# Patient Record
Sex: Female | Born: 1945 | Race: White | Hispanic: No | Smoking: Former smoker
Health system: Southern US, Community
[De-identification: ages and names within clinical notes are randomized; demographics above are authoritative.]

## PROBLEM LIST (undated history)

## (undated) DIAGNOSIS — K219 Gastro-esophageal reflux disease without esophagitis: Secondary | ICD-10-CM

## (undated) DIAGNOSIS — E039 Hypothyroidism, unspecified: Secondary | ICD-10-CM

## (undated) DIAGNOSIS — M1990 Unspecified osteoarthritis, unspecified site: Secondary | ICD-10-CM

## (undated) HISTORY — PX: CHOLECYSTECTOMY: SHX55

## (undated) HISTORY — PX: ABDOMINAL HYSTERECTOMY: SHX81

---

## 2004-09-03 ENCOUNTER — Ambulatory Visit: Payer: Self-pay | Admitting: General Practice

## 2005-10-06 ENCOUNTER — Ambulatory Visit: Payer: Self-pay | Admitting: General Practice

## 2005-11-18 ENCOUNTER — Ambulatory Visit: Payer: Self-pay | Admitting: Gastroenterology

## 2006-11-02 ENCOUNTER — Ambulatory Visit: Payer: Self-pay | Admitting: Endocrinology

## 2007-12-13 ENCOUNTER — Ambulatory Visit: Payer: Self-pay | Admitting: Internal Medicine

## 2008-07-20 ENCOUNTER — Emergency Department: Payer: Self-pay | Admitting: Emergency Medicine

## 2008-08-31 ENCOUNTER — Inpatient Hospital Stay: Payer: Self-pay | Admitting: Surgery

## 2009-01-17 ENCOUNTER — Ambulatory Visit: Payer: Self-pay | Admitting: Internal Medicine

## 2009-11-27 DIAGNOSIS — Z0000 Encounter for general adult medical examination without abnormal findings: Secondary | ICD-10-CM | POA: Insufficient documentation

## 2009-11-27 DIAGNOSIS — M810 Age-related osteoporosis without current pathological fracture: Secondary | ICD-10-CM | POA: Insufficient documentation

## 2010-01-23 ENCOUNTER — Ambulatory Visit: Payer: Self-pay | Admitting: Family Medicine

## 2010-12-18 DIAGNOSIS — E782 Mixed hyperlipidemia: Secondary | ICD-10-CM | POA: Insufficient documentation

## 2010-12-18 DIAGNOSIS — J019 Acute sinusitis, unspecified: Secondary | ICD-10-CM | POA: Insufficient documentation

## 2011-02-10 ENCOUNTER — Ambulatory Visit: Payer: Self-pay | Admitting: Family Medicine

## 2011-02-11 ENCOUNTER — Ambulatory Visit: Payer: Self-pay | Admitting: Gastroenterology

## 2013-01-11 ENCOUNTER — Ambulatory Visit: Payer: Self-pay | Admitting: Family Medicine

## 2013-01-17 ENCOUNTER — Ambulatory Visit: Payer: Self-pay | Admitting: Family Medicine

## 2013-04-28 DIAGNOSIS — R42 Dizziness and giddiness: Secondary | ICD-10-CM | POA: Insufficient documentation

## 2013-04-28 DIAGNOSIS — J309 Allergic rhinitis, unspecified: Secondary | ICD-10-CM | POA: Insufficient documentation

## 2013-05-18 DIAGNOSIS — N951 Menopausal and female climacteric states: Secondary | ICD-10-CM | POA: Insufficient documentation

## 2013-05-18 DIAGNOSIS — R922 Inconclusive mammogram: Secondary | ICD-10-CM | POA: Insufficient documentation

## 2014-01-29 ENCOUNTER — Ambulatory Visit: Payer: Self-pay | Admitting: Family Medicine

## 2015-01-22 DIAGNOSIS — R300 Dysuria: Secondary | ICD-10-CM | POA: Insufficient documentation

## 2015-05-17 ENCOUNTER — Other Ambulatory Visit: Payer: Self-pay | Admitting: Family Medicine

## 2015-05-17 DIAGNOSIS — Z1231 Encounter for screening mammogram for malignant neoplasm of breast: Secondary | ICD-10-CM

## 2015-05-22 ENCOUNTER — Ambulatory Visit
Admission: RE | Admit: 2015-05-22 | Discharge: 2015-05-22 | Disposition: A | Discharge status: Home / Self Care | Payer: Medicare HMO | Source: Ambulatory Visit | Attending: Family Medicine | Admitting: Family Medicine

## 2015-05-22 ENCOUNTER — Other Ambulatory Visit: Payer: Self-pay | Admitting: Family Medicine

## 2015-05-22 DIAGNOSIS — Z1231 Encounter for screening mammogram for malignant neoplasm of breast: Secondary | ICD-10-CM

## 2015-07-29 ENCOUNTER — Encounter
Admission: RE | Disposition: A | Discharge status: Home / Self Care | Payer: Self-pay | Source: Ambulatory Visit | Attending: Gastroenterology

## 2015-07-29 ENCOUNTER — Ambulatory Visit
Admission: RE | Admit: 2015-07-29 | Discharge: 2015-07-29 | Disposition: A | Discharge status: Home / Self Care | Payer: Medicare HMO | Source: Ambulatory Visit | Attending: Gastroenterology | Admitting: Gastroenterology

## 2015-07-29 ENCOUNTER — Ambulatory Visit: Payer: Medicare HMO | Admitting: Anesthesiology

## 2015-07-29 ENCOUNTER — Encounter: Payer: Self-pay | Admitting: Anesthesiology

## 2015-07-29 DIAGNOSIS — Z87891 Personal history of nicotine dependence: Secondary | ICD-10-CM | POA: Insufficient documentation

## 2015-07-29 DIAGNOSIS — Z90710 Acquired absence of both cervix and uterus: Secondary | ICD-10-CM | POA: Insufficient documentation

## 2015-07-29 DIAGNOSIS — Z9049 Acquired absence of other specified parts of digestive tract: Secondary | ICD-10-CM | POA: Insufficient documentation

## 2015-07-29 DIAGNOSIS — Z79899 Other long term (current) drug therapy: Secondary | ICD-10-CM | POA: Insufficient documentation

## 2015-07-29 DIAGNOSIS — Z7951 Long term (current) use of inhaled steroids: Secondary | ICD-10-CM | POA: Diagnosis not present

## 2015-07-29 DIAGNOSIS — K5730 Diverticulosis of large intestine without perforation or abscess without bleeding: Secondary | ICD-10-CM | POA: Insufficient documentation

## 2015-07-29 DIAGNOSIS — M1990 Unspecified osteoarthritis, unspecified site: Secondary | ICD-10-CM | POA: Diagnosis not present

## 2015-07-29 DIAGNOSIS — Z1211 Encounter for screening for malignant neoplasm of colon: Secondary | ICD-10-CM | POA: Diagnosis present

## 2015-07-29 DIAGNOSIS — E039 Hypothyroidism, unspecified: Secondary | ICD-10-CM | POA: Diagnosis not present

## 2015-07-29 HISTORY — DX: Gastro-esophageal reflux disease without esophagitis: K21.9

## 2015-07-29 HISTORY — DX: Unspecified osteoarthritis, unspecified site: M19.90

## 2015-07-29 HISTORY — DX: Hypothyroidism, unspecified: E03.9

## 2015-07-29 HISTORY — PX: COLONOSCOPY WITH PROPOFOL: SHX5780

## 2015-07-29 SURGERY — COLONOSCOPY WITH PROPOFOL
Anesthesia: General

## 2015-07-29 MED ORDER — PROPOFOL 500 MG/50ML IV EMUL
INTRAVENOUS | Status: DC | PRN
  Administered 2015-07-29: 150 ug/kg/min via INTRAVENOUS

## 2015-07-29 MED ORDER — PROPOFOL 10 MG/ML IV BOLUS
INTRAVENOUS | Status: DC | PRN
  Administered 2015-07-29 (×2): 50 mg via INTRAVENOUS

## 2015-07-29 MED ORDER — SODIUM CHLORIDE 0.9 % IV SOLN
INTRAVENOUS | Status: DC
  Administered 2015-07-29: 15:00:00 via INTRAVENOUS
  Administered 2015-07-29: 1000 mL via INTRAVENOUS

## 2015-07-29 MED ORDER — SODIUM CHLORIDE 0.9 % IV SOLN: INTRAVENOUS | Status: DC

## 2015-07-29 NOTE — H&P (Signed)
    Primary Care Physician:  Dortha KernBLISS, LAURA K, MD Primary Gastroenterologist:  Dr. Bluford Kaufmannh  Pre-Procedure History & Physical: HPI:  Sheila Johnston is a 70 y.o. female is here for an colonoscopy   Past Medical History  Diagnosis Date  . Arthritis   . Hypothyroidism     Past Surgical History  Procedure Laterality Date  . Cholecystectomy    . Abdominal hysterectomy      Prior to Admission medications   Medication Sig Start Date End Date Taking? Authorizing Provider  Calcium Ascorbate 500 MG TABS Take 500 tablets by mouth daily.   Yes Historical Provider, MD  calcium citrate-vitamin D (CITRACAL+D) 315-200 MG-UNIT tablet Take 1 tablet by mouth 2 (two) times daily.   Yes Historical Provider, MD  Cyanocobalamin (VITAMIN B 12) 250 MCG LOZG Take 2 tablets by mouth daily.   Yes Historical Provider, MD  estradiol (CLIMARA - DOSED IN MG/24 HR) 0.0375 mg/24hr patch Place 0.0375 mg onto the skin once a week.   Yes Historical Provider, MD  fluticasone (FLONASE) 50 MCG/ACT nasal spray Place 2 sprays into both nostrils daily.   Yes Historical Provider, MD  levothyroxine (SYNTHROID, LEVOTHROID) 75 MCG tablet Take 75 mcg by mouth daily before breakfast.   Yes Historical Provider, MD  Multiple Vitamin (MULTIVITAMIN WITH MINERALS) TABS tablet Take 1 tablet by mouth daily.   Yes Historical Provider, MD  omega-3 acid ethyl esters (LOVAZA) 1 g capsule Take 1 g by mouth 2 (two) times daily.   Yes Historical Provider, MD    Allergies as of 07/23/2015  . (Not on File)    History reviewed. No pertinent family history.  Social History   Social History  . Marital Status: Married    Spouse Name: N/A  . Number of Children: N/A  . Years of Education: N/A   Occupational History  . Not on file.   Social History Main Topics  . Smoking status: Former Games developermoker  . Smokeless tobacco: Not on file  . Alcohol Use: No  . Drug Use: No  . Sexual Activity: Not on file   Other Topics Concern  . Not on file   Social  History Narrative    Review of Systems: See HPI, otherwise negative ROS  Physical Exam: There were no vitals taken for this visit. General:   Alert,  pleasant and cooperative in NAD Head:  Normocephalic and atraumatic. Neck:  Supple; no masses or thyromegaly. Lungs:  Clear throughout to auscultation.    Heart:  Regular rate and rhythm. Abdomen:  Soft, nontender and nondistended. Normal bowel sounds, without guarding, and without rebound.   Neurologic:  Alert and  oriented x4;  grossly normal neurologically.  Impression/Plan: Sheila Johnston is here for an colonoscopy to be performed for screening Risks, benefits, limitations, and alternatives regarding  colonoscopy have been reviewed with the patient.  Questions have been answered.  All parties agreeable.   Sheila Johnston, Ezzard StandingPAUL Y, MD  07/29/2015, 1:53 PM

## 2015-07-29 NOTE — Anesthesia Preprocedure Evaluation (Signed)
Anesthesia Evaluation  Patient identified by MRN, date of birth, ID band Patient awake    Reviewed: Allergy & Precautions, NPO status , Patient's Chart, lab work & pertinent test results, reviewed documented beta blocker date and time   Airway Mallampati: II  TM Distance: >3 FB     Dental  (+) Chipped   Pulmonary former smoker,           Cardiovascular      Neuro/Psych    GI/Hepatic   Endo/Other  Hypothyroidism   Renal/GU      Musculoskeletal  (+) Arthritis ,   Abdominal   Peds  Hematology   Anesthesia Other Findings   Reproductive/Obstetrics                             Anesthesia Physical Anesthesia Plan  ASA: III  Anesthesia Plan: General   Post-op Pain Management:    Induction: Intravenous  Airway Management Planned: Nasal Cannula  Additional Equipment:   Intra-op Plan:   Post-operative Plan:   Informed Consent: I have reviewed the patients History and Physical, chart, labs and discussed the procedure including the risks, benefits and alternatives for the proposed anesthesia with the patient or authorized representative who has indicated his/her understanding and acceptance.     Plan Discussed with: CRNA  Anesthesia Plan Comments:         Anesthesia Quick Evaluation

## 2015-07-29 NOTE — Op Note (Signed)
[facility] Patient Name: Sheila Johnston Procedure Date: 07/29/2015 2:49 PM MRN: [id number] Account #: [id number] Date of Birth: 09/18/1945 Admit Type: Outpatient Age: 70 Room: [facility] ENDO ROOM 4 Gender: Female Note Status: Finalized Procedure:            Colonoscopy Indications:          Screening for colorectal malignant neoplasm Providers:            Ezzard Standing. Bluford Kaufmann, MD Referring MD:         Dortha Kern (Referring MD) Medicines:            Monitored Anesthesia Care Complications:        No immediate complications. Procedure:            Pre-Anesthesia Assessment:                       - Prior to the procedure, a History and Physical was                        performed, and patient medications, allergies and                        sensitivities were reviewed. The patient's tolerance of                        previous anesthesia was reviewed.                       - The risks and benefits of the procedure and the                        sedation options and risks were discussed with the                        patient. All questions were answered and informed                        consent was obtained.                       - After reviewing the risks and benefits, the patient                        was deemed in satisfactory condition to undergo the                        procedure.                       After obtaining informed consent, the colonoscope was                        passed under direct vision. Throughout the procedure,                        the patient's blood pressure, pulse, and oxygen                        saturations were monitored continuously. The  Colonoscope was introduced through the anus and                        advanced to the the cecum, identified by appendiceal                        orifice and ileocecal valve. The colonoscopy was                        performed with difficulty due to multiple  diverticula                        in the colon. The patient tolerated the procedure well. Findings:      Multiple small-mouthed diverticula were found in the sigmoid colon.      The exam was otherwise without abnormality. Impression:           - Diverticulosis in the sigmoid colon.                       - The examination was otherwise normal.                       - No specimens collected. Recommendation:       - Discharge patient to home.                       - The findings and recommendations were discussed with                        the patient. Procedure Code(s):    --- Professional ---                       [id number], Colonoscopy, flexible; diagnostic, including                        collection of specimen(s) by brushing or washing, when                        performed (separate procedure) Diagnosis Code(s):    --- Professional ---                       Z12.11, Encounter for screening for malignant neoplasm                        of colon                       K57.30, Diverticulosis of large intestine without                        perforation or abscess without bleeding CPT copyright 2016 American Medical Association. All rights reserved. The codes documented in this report are preliminary and upon coder review may  be revised to meet current compliance requirements. Wallace CullensPaul Y Ravindra Baranek, MD 07/29/2015 3:33:26 PM This report has been signed electronically. Number of Addenda: 0 Note Initiated On: 07/29/2015 2:49 PM Scope Withdrawal Time: 0 hours 7 minutes 58 seconds  Total Procedure Duration: 0 hours 15 minutes 45 seconds       [facility]

## 2015-07-29 NOTE — Transfer of Care (Signed)
Immediate Anesthesia Transfer of Care Note  Patient: Sheila Johnston  Procedure(s) Performed: Procedure(s): COLONOSCOPY WITH PROPOFOL (N/A)  Patient Location: PACU  Anesthesia Type:General  Level of Consciousness: sedated  Airway & Oxygen Therapy: Patient Spontanous Breathing and Patient connected to nasal cannula oxygen  Post-op Assessment: Report given to RN and Post -op Vital signs reviewed and stable  Post vital signs: Reviewed and stable  Last Vitals:  Filed Vitals:   07/29/15 1443  BP: 117/55  Pulse: 81  Temp: 36.4 C  Resp: 18    Complications: No apparent anesthesia complications

## 2015-08-01 NOTE — Anesthesia Postprocedure Evaluation (Signed)
Anesthesia Post Note  Patient: Sheila Johnston  Procedure(s) Performed: Procedure(s) (LRB): COLONOSCOPY WITH PROPOFOL (N/A)  Patient location during evaluation: Endoscopy Anesthesia Type: General Level of consciousness: awake and alert Pain management: pain level controlled Vital Signs Assessment: post-procedure vital signs reviewed and stable Respiratory status: spontaneous breathing, nonlabored ventilation, respiratory function stable and patient connected to nasal cannula oxygen Cardiovascular status: blood pressure returned to baseline and stable Postop Assessment: no signs of nausea or vomiting Anesthetic complications: no    Last Vitals:  Filed Vitals:   07/29/15 1556 07/29/15 1606  BP: 103/71 100/68  Pulse: 75 76  Temp:    Resp: 16 15    Last Pain:  Filed Vitals:   07/30/15 0729  PainSc: 0-No pain                 Angeleah Labrake S

## 2016-08-18 ENCOUNTER — Other Ambulatory Visit: Payer: Self-pay | Admitting: Family Medicine

## 2016-08-18 DIAGNOSIS — N951 Menopausal and female climacteric states: Secondary | ICD-10-CM

## 2016-08-19 ENCOUNTER — Telehealth: Payer: Self-pay | Admitting: *Deleted

## 2016-08-19 ENCOUNTER — Other Ambulatory Visit: Payer: Self-pay | Admitting: Family Medicine

## 2016-08-19 DIAGNOSIS — Z1231 Encounter for screening mammogram for malignant neoplasm of breast: Secondary | ICD-10-CM

## 2016-08-19 DIAGNOSIS — N951 Menopausal and female climacteric states: Secondary | ICD-10-CM

## 2016-08-19 DIAGNOSIS — Z87891 Personal history of nicotine dependence: Secondary | ICD-10-CM

## 2016-08-19 NOTE — Telephone Encounter (Signed)
Received referral for initial lung cancer screening scan. Contacted patient and obtained smoking history,(former, quit 04/14/2007, 43 pack year) as well as answering questions related to screening process. Patient denies signs of lung cancer such as weight loss or hemoptysis. Patient denies comorbidity that would prevent curative treatment if lung cancer were found. Patient is scheduled for shared decision making visit and CT scan on 09/01/16.

## 2016-08-31 ENCOUNTER — Encounter: Payer: Self-pay | Admitting: Oncology

## 2016-09-01 ENCOUNTER — Ambulatory Visit
Admission: RE | Admit: 2016-09-01 | Discharge: 2016-09-01 | Disposition: A | Discharge status: Home / Self Care | Payer: Medicare HMO | Source: Ambulatory Visit | Attending: Oncology | Admitting: Oncology

## 2016-09-01 ENCOUNTER — Inpatient Hospital Stay: Payer: Medicare HMO | Attending: Oncology | Admitting: Oncology

## 2016-09-01 DIAGNOSIS — Z87891 Personal history of nicotine dependence: Secondary | ICD-10-CM

## 2016-09-01 DIAGNOSIS — Z122 Encounter for screening for malignant neoplasm of respiratory organs: Secondary | ICD-10-CM

## 2016-09-02 ENCOUNTER — Encounter: Payer: Self-pay | Admitting: *Deleted

## 2016-09-07 DIAGNOSIS — Z87891 Personal history of nicotine dependence: Secondary | ICD-10-CM | POA: Insufficient documentation

## 2016-09-07 NOTE — Progress Notes (Signed)
In accordance with CMS guidelines, patient has met eligibility criteria including age, absence of signs or symptoms of lung cancer.  Social History  Substance Use Topics  . Smoking status: Former Smoker    Packs/day: 1.00    Years: 43.00    Quit date: 04/14/2007  . Smokeless tobacco: Not on file  . Alcohol use No     A shared decision-making session was conducted prior to the performance of CT scan. This includes one or more decision aids, includes benefits and harms of screening, follow-up diagnostic testing, over-diagnosis, false positive rate, and total radiation exposure.  Counseling on the importance of adherence to annual lung cancer LDCT screening, impact of co-morbidities, and ability or willingness to undergo diagnosis and treatment is imperative for compliance of the program.  Counseling on the importance of continued smoking cessation for former smokers; the importance of smoking cessation for current smokers, and information about tobacco cessation interventions have been given to patient including [place] and [employer] quit [place] programs.  Written order for lung cancer screening with LDCT has been given to the patient and any and all questions have been answered to the best of my abilities.   Yearly follow up will be coordinated by Burgess Estelle, Thoracic Navigator.

## 2016-10-26 ENCOUNTER — Ambulatory Visit
Admission: RE | Admit: 2016-10-26 | Discharge: 2016-10-26 | Disposition: A | Discharge status: Home / Self Care | Payer: Medicare HMO | Source: Ambulatory Visit | Attending: Family Medicine | Admitting: Family Medicine

## 2016-10-26 DIAGNOSIS — N951 Menopausal and female climacteric states: Secondary | ICD-10-CM | POA: Diagnosis not present

## 2016-10-26 DIAGNOSIS — Z1231 Encounter for screening mammogram for malignant neoplasm of breast: Secondary | ICD-10-CM | POA: Diagnosis not present

## 2017-08-30 ENCOUNTER — Telehealth: Payer: Self-pay | Admitting: *Deleted

## 2017-08-30 DIAGNOSIS — Z122 Encounter for screening for malignant neoplasm of respiratory organs: Secondary | ICD-10-CM

## 2017-08-30 DIAGNOSIS — Z87891 Personal history of nicotine dependence: Secondary | ICD-10-CM

## 2017-08-30 NOTE — Telephone Encounter (Signed)
Notified patient that annual lung cancer screening low dose CT scan is due currently or will be in near future. Confirmed that patient is within the age range of 55-77, and asymptomatic, (no signs or symptoms of lung cancer). Patient denies illness that would prevent curative treatment for lung cancer if found. Verified smoking history, (former, quit 2009, 43 pack year). The shared decision making visit was done 09/01/16. Patient is agreeable for CT scan being scheduled.

## 2017-09-09 ENCOUNTER — Ambulatory Visit
Admission: RE | Admit: 2017-09-09 | Discharge: 2017-09-09 | Disposition: A | Discharge status: Home / Self Care | Payer: Medicare HMO | Source: Ambulatory Visit | Attending: Oncology | Admitting: Oncology

## 2017-09-09 DIAGNOSIS — Z87891 Personal history of nicotine dependence: Secondary | ICD-10-CM | POA: Diagnosis not present

## 2017-09-09 DIAGNOSIS — I700 Atherosclerosis of aorta: Secondary | ICD-10-CM | POA: Insufficient documentation

## 2017-09-09 DIAGNOSIS — J432 Centrilobular emphysema: Secondary | ICD-10-CM | POA: Diagnosis not present

## 2017-09-09 DIAGNOSIS — J438 Other emphysema: Secondary | ICD-10-CM | POA: Insufficient documentation

## 2017-09-09 DIAGNOSIS — Z122 Encounter for screening for malignant neoplasm of respiratory organs: Secondary | ICD-10-CM | POA: Diagnosis not present

## 2017-09-13 ENCOUNTER — Encounter: Payer: Self-pay | Admitting: *Deleted

## 2017-11-30 ENCOUNTER — Other Ambulatory Visit: Payer: Self-pay | Admitting: Family Medicine

## 2017-11-30 DIAGNOSIS — Z1231 Encounter for screening mammogram for malignant neoplasm of breast: Secondary | ICD-10-CM

## 2017-12-17 ENCOUNTER — Ambulatory Visit
Admission: RE | Admit: 2017-12-17 | Discharge: 2017-12-17 | Disposition: A | Discharge status: Home / Self Care | Payer: Medicare HMO | Source: Ambulatory Visit | Attending: Family Medicine | Admitting: Family Medicine

## 2017-12-17 DIAGNOSIS — Z1231 Encounter for screening mammogram for malignant neoplasm of breast: Secondary | ICD-10-CM | POA: Insufficient documentation

## 2017-12-20 ENCOUNTER — Other Ambulatory Visit: Payer: Self-pay | Admitting: Family Medicine

## 2017-12-20 DIAGNOSIS — N6489 Other specified disorders of breast: Secondary | ICD-10-CM

## 2017-12-20 DIAGNOSIS — R928 Other abnormal and inconclusive findings on diagnostic imaging of breast: Secondary | ICD-10-CM

## 2017-12-29 ENCOUNTER — Ambulatory Visit
Admission: RE | Admit: 2017-12-29 | Discharge: 2017-12-29 | Disposition: A | Discharge status: Home / Self Care | Payer: Medicare HMO | Source: Ambulatory Visit | Attending: Family Medicine | Admitting: Family Medicine

## 2017-12-29 DIAGNOSIS — R928 Other abnormal and inconclusive findings on diagnostic imaging of breast: Secondary | ICD-10-CM | POA: Diagnosis not present

## 2017-12-29 DIAGNOSIS — N6489 Other specified disorders of breast: Secondary | ICD-10-CM | POA: Diagnosis present

## 2018-07-19 IMAGING — CT CT CHEST LUNG CANCER SCREENING LOW DOSE W/O CM
1 of 2 series · 15 of 40 positions shown, 19 images · non-contrast
Comparison: None.

CLINICAL DATA: 71-year-old female former smoker, quit 9 years ago,
with 43 pack-year history of smoking, for initial lung cancer
screening

EXAM:
CT CHEST WITHOUT CONTRAST LOW-DOSE FOR LUNG CANCER SCREENING
TECHNIQUE: Multidetector CT imaging of the chest was performed following the
standard protocol without IV contrast.

[Series 2: axial st · axial · 0.66mm/px · z∈[-619,-334]mm · 15 of 63 slices shown, 19 images]
[im 3/63  mediastinal]
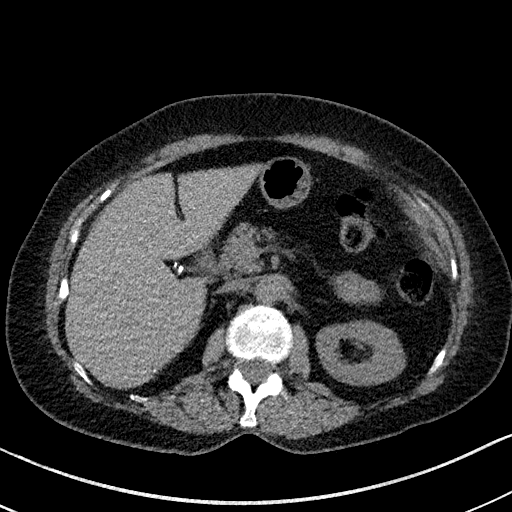
[im 3/63  lung]
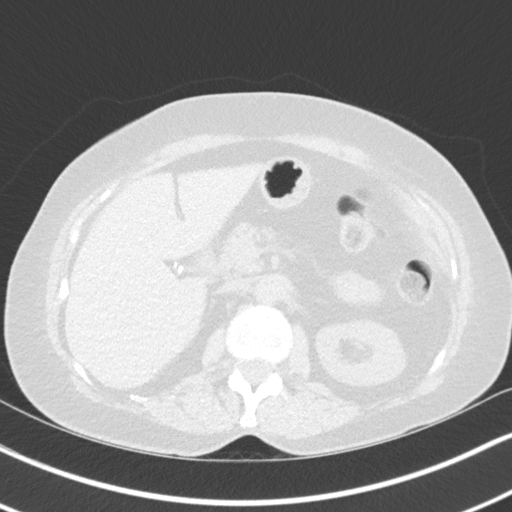
[im 8/63  lung]
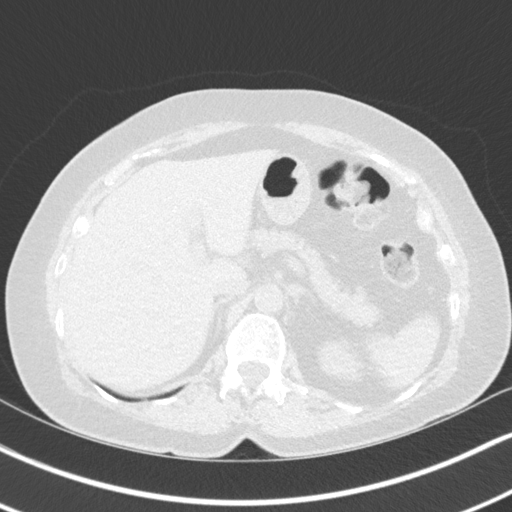
[im 12/63  lung]
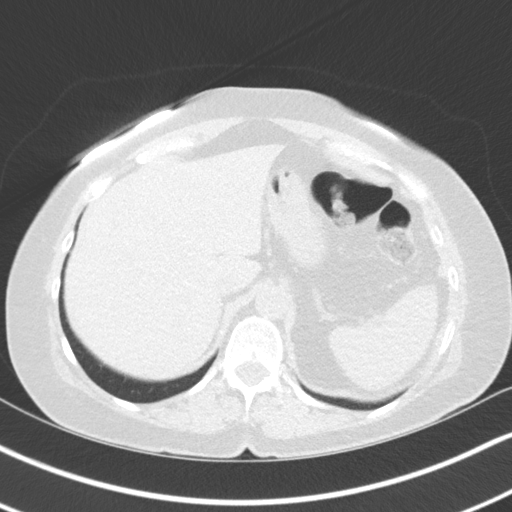
[im 16/63  lung]
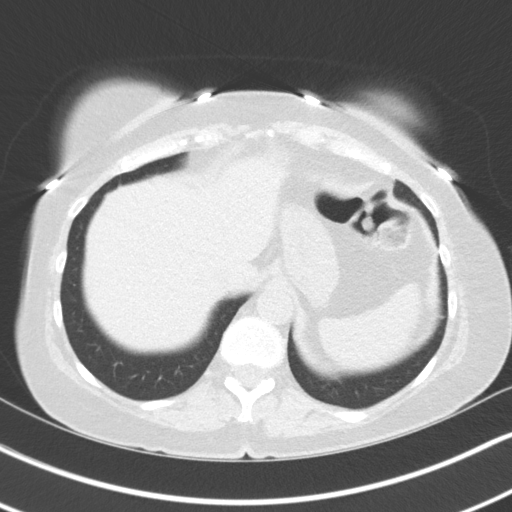
[im 20/63  mediastinal]
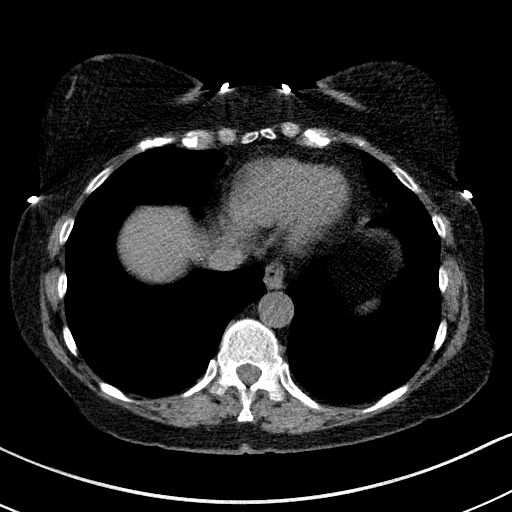
[im 20/63  lung]
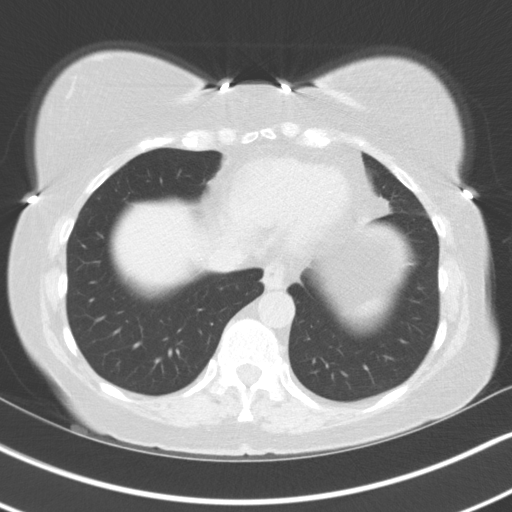
[im 24/63  lung]
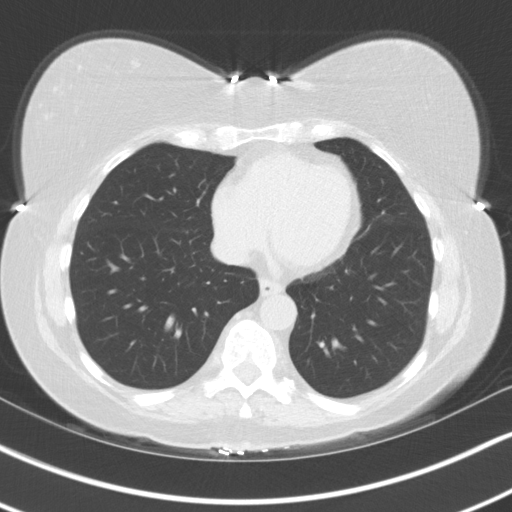
[im 29/63  lung]
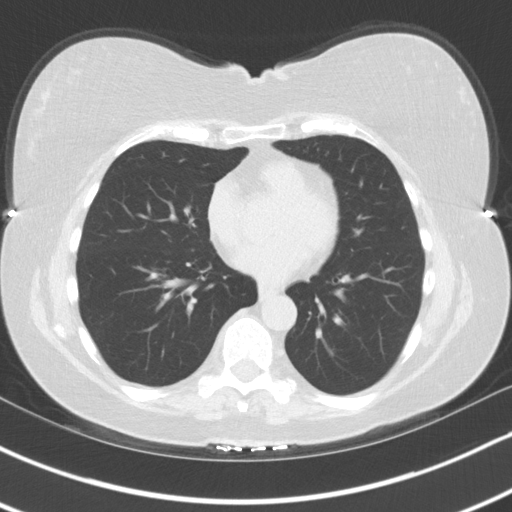
[im 32/63  lung]
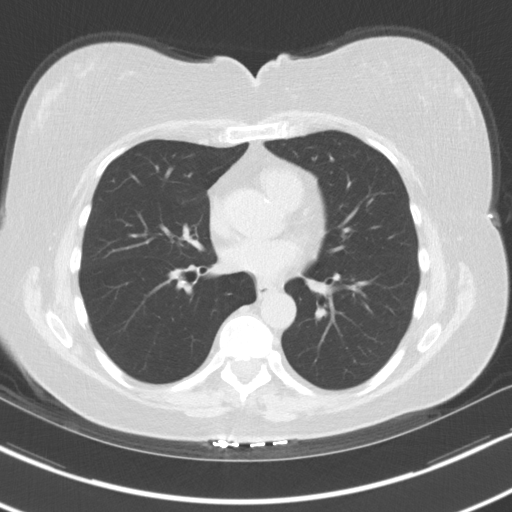
[im 34/63  mediastinal]
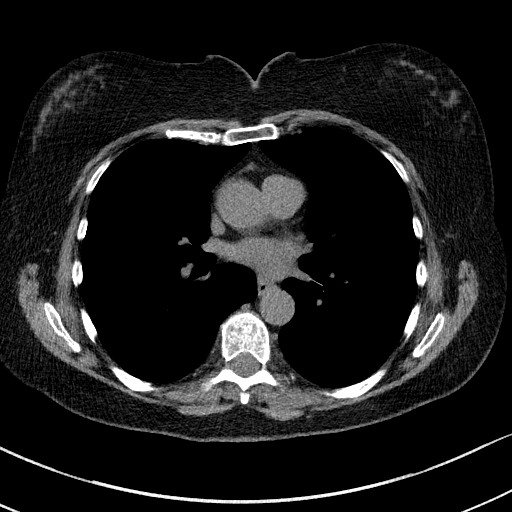
[im 34/63  lung]
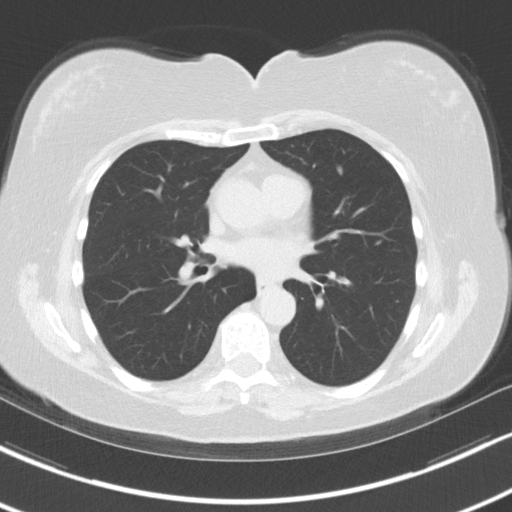
[im 39/63  lung]
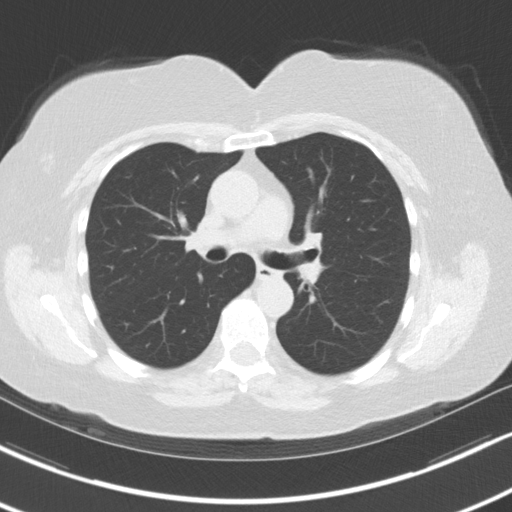
[im 43/63  lung]
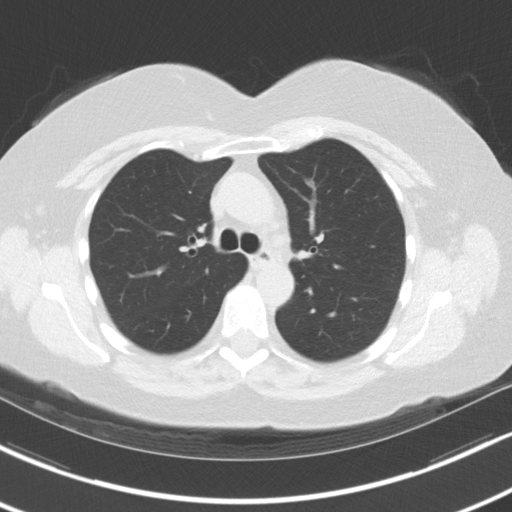
[im 47/63  lung]
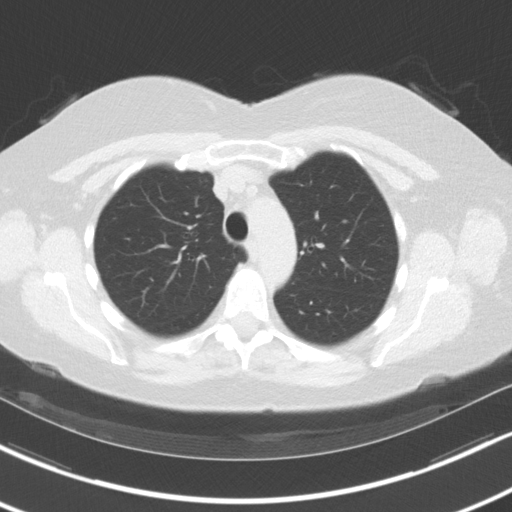
[im 51/63  mediastinal]
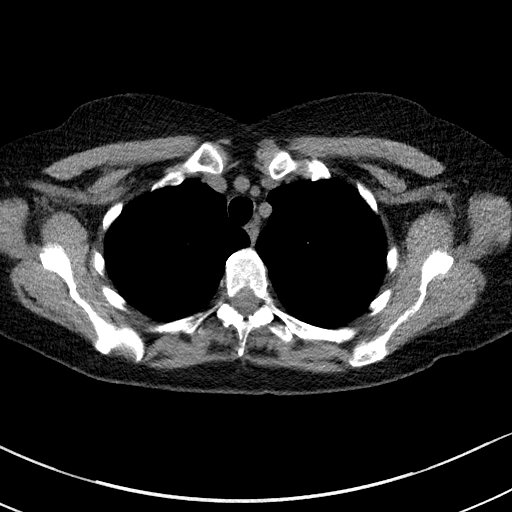
[im 51/63  lung]
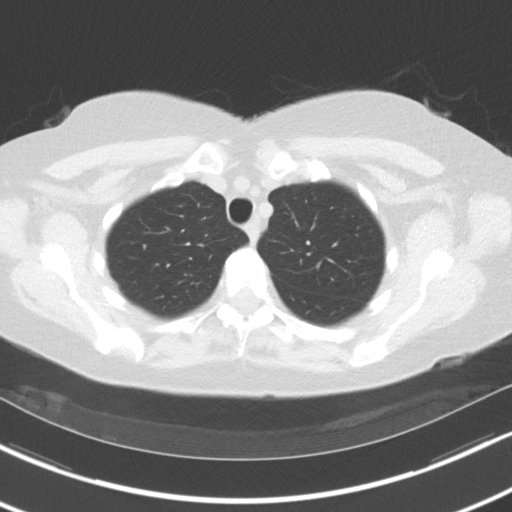
[im 55/63  lung]
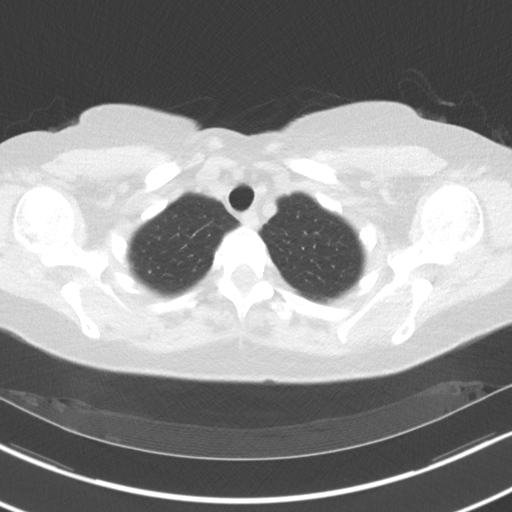
[im 60/63  lung]
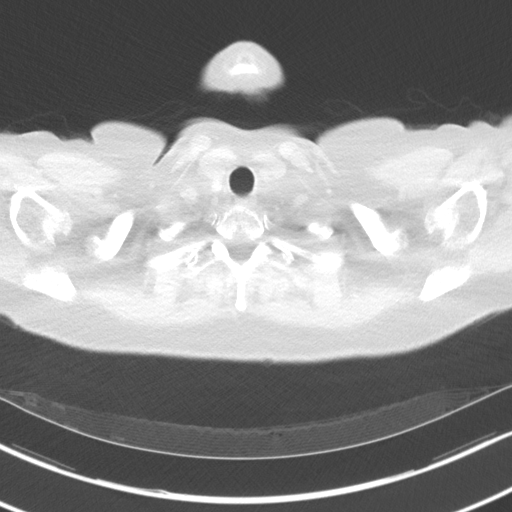

[15 of 40 positions shown; findings below may reference images not displayed]

FINDINGS: Cardiovascular: The heart is normal in size. No pericardial
effusion.

No evidence of thoracic aortic aneurysm. Mild atherosclerotic
calcifications.

Mediastinum/Nodes: No suspicious mediastinal lymphadenopathy.

Lungs/Pleura: Biapical pleural-parenchymal scarring.

2.6 mm calcified granuloma in the right lung apex, benign. 2.5 mm
subpleural calcified granuloma in the right lower lobe, benign.

No suspicious pulmonary nodules.

No focal consolidation.

No pleural effusion or pneumothorax.

Upper Abdomen: Visualized upper abdomen is notable for
cholecystectomy clips.

Musculoskeletal: Visualized osseous structures are within normal
limits.
IMPRESSION: Lung-RADS 1, negative. Continue annual screening with low-dose chest
CT without contrast in 12 months.

## 2018-10-01 ENCOUNTER — Telehealth: Payer: Self-pay | Admitting: *Deleted

## 2018-10-01 NOTE — Telephone Encounter (Signed)
Patient has been notified that lung cancer screening CT scan is due currently or will be in near future. Confirmed that patient is within the appropriate age range, and asymptomatic, (no signs or symptoms of lung cancer). Patient denies illness that would prevent curative treatment for lung cancer if found. Verified smoking history:  Non-Smoker,  States that she quit 10 years ago. Patient is agreeable for CT scan being scheduled.

## 2018-10-05 ENCOUNTER — Other Ambulatory Visit: Payer: Self-pay | Admitting: *Deleted

## 2018-10-05 DIAGNOSIS — Z87891 Personal history of nicotine dependence: Secondary | ICD-10-CM

## 2018-10-05 DIAGNOSIS — Z122 Encounter for screening for malignant neoplasm of respiratory organs: Secondary | ICD-10-CM

## 2018-10-17 ENCOUNTER — Ambulatory Visit
Admission: RE | Admit: 2018-10-17 | Discharge: 2018-10-17 | Disposition: A | Discharge status: Home / Self Care | Payer: Medicare HMO | Source: Ambulatory Visit | Attending: Oncology | Admitting: Oncology

## 2018-10-17 ENCOUNTER — Other Ambulatory Visit: Payer: Self-pay

## 2018-10-17 DIAGNOSIS — Z87891 Personal history of nicotine dependence: Secondary | ICD-10-CM | POA: Diagnosis present

## 2018-10-17 DIAGNOSIS — Z122 Encounter for screening for malignant neoplasm of respiratory organs: Secondary | ICD-10-CM | POA: Diagnosis not present

## 2018-10-19 ENCOUNTER — Encounter: Payer: Self-pay | Admitting: *Deleted

## 2018-12-22 ENCOUNTER — Other Ambulatory Visit: Payer: Self-pay | Admitting: Family Medicine

## 2018-12-22 DIAGNOSIS — Z1231 Encounter for screening mammogram for malignant neoplasm of breast: Secondary | ICD-10-CM

## 2019-01-31 ENCOUNTER — Ambulatory Visit
Admission: RE | Admit: 2019-01-31 | Discharge: 2019-01-31 | Disposition: A | Discharge status: Home / Self Care | Payer: Medicare HMO | Source: Ambulatory Visit | Attending: Family Medicine | Admitting: Family Medicine

## 2019-01-31 DIAGNOSIS — Z1231 Encounter for screening mammogram for malignant neoplasm of breast: Secondary | ICD-10-CM | POA: Insufficient documentation

## 2019-10-13 ENCOUNTER — Telehealth: Payer: Self-pay

## 2019-10-13 DIAGNOSIS — Z122 Encounter for screening for malignant neoplasm of respiratory organs: Secondary | ICD-10-CM

## 2019-10-13 DIAGNOSIS — Z87891 Personal history of nicotine dependence: Secondary | ICD-10-CM

## 2019-10-13 NOTE — Telephone Encounter (Signed)
Message left notifying patient that it is time to schedule the low dose lung cancer screening CT scan.  Instructed patient to return call to Shawn Perkins at [phone] to verify information prior to CT scan being scheduled.    

## 2019-10-18 NOTE — Telephone Encounter (Signed)
Patient has been notified that annual lung cancer screening low dose CT scan is due currently or will be in near future. Confirmed that patient is within the age range of 55-77, and asymptomatic, (no signs or symptoms of lung cancer). Patient denies illness that would prevent curative treatment for lung cancer if found. Verified smoking history, (former, quit 2009, 43 pack year). The shared decision making visit was done 09/01/16. Patient is agreeable for CT scan being scheduled.

## 2019-10-18 NOTE — Addendum Note (Signed)
Addended by: Jonne Ply on: 10/18/2019 01:18 PM   Modules accepted: Orders

## 2019-10-31 ENCOUNTER — Other Ambulatory Visit: Payer: Self-pay

## 2019-10-31 ENCOUNTER — Ambulatory Visit
Admission: RE | Admit: 2019-10-31 | Discharge: 2019-10-31 | Disposition: A | Discharge status: Home / Self Care | Payer: Medicare HMO | Source: Ambulatory Visit | Attending: Oncology | Admitting: Oncology

## 2019-10-31 DIAGNOSIS — Z122 Encounter for screening for malignant neoplasm of respiratory organs: Secondary | ICD-10-CM | POA: Diagnosis present

## 2019-10-31 DIAGNOSIS — Z87891 Personal history of nicotine dependence: Secondary | ICD-10-CM | POA: Insufficient documentation

## 2019-11-03 ENCOUNTER — Encounter: Payer: Self-pay | Admitting: *Deleted

## 2020-01-08 ENCOUNTER — Other Ambulatory Visit: Payer: Self-pay | Admitting: Family Medicine

## 2020-01-08 DIAGNOSIS — Z1231 Encounter for screening mammogram for malignant neoplasm of breast: Secondary | ICD-10-CM

## 2020-02-01 ENCOUNTER — Other Ambulatory Visit: Payer: Self-pay

## 2020-02-01 ENCOUNTER — Ambulatory Visit
Admission: RE | Admit: 2020-02-01 | Discharge: 2020-02-01 | Disposition: A | Discharge status: Home / Self Care | Payer: Medicare HMO | Source: Ambulatory Visit | Attending: Family Medicine | Admitting: Family Medicine

## 2020-02-01 DIAGNOSIS — Z1231 Encounter for screening mammogram for malignant neoplasm of breast: Secondary | ICD-10-CM

## 2020-12-19 ENCOUNTER — Other Ambulatory Visit: Payer: Self-pay | Admitting: *Deleted

## 2020-12-19 DIAGNOSIS — Z87891 Personal history of nicotine dependence: Secondary | ICD-10-CM

## 2020-12-25 ENCOUNTER — Other Ambulatory Visit: Payer: Self-pay

## 2020-12-25 ENCOUNTER — Ambulatory Visit
Admission: RE | Admit: 2020-12-25 | Discharge: 2020-12-25 | Disposition: A | Discharge status: Home / Self Care | Payer: Medicare HMO | Source: Ambulatory Visit | Attending: Acute Care | Admitting: Acute Care

## 2020-12-25 DIAGNOSIS — Z87891 Personal history of nicotine dependence: Secondary | ICD-10-CM | POA: Diagnosis not present

## 2020-12-31 ENCOUNTER — Other Ambulatory Visit: Payer: Self-pay | Admitting: Family Medicine

## 2021-01-02 ENCOUNTER — Other Ambulatory Visit: Payer: Self-pay | Admitting: *Deleted

## 2021-01-02 DIAGNOSIS — Z87891 Personal history of nicotine dependence: Secondary | ICD-10-CM

## 2021-02-24 ENCOUNTER — Other Ambulatory Visit: Payer: Self-pay | Admitting: Family Medicine

## 2021-02-24 DIAGNOSIS — Z1231 Encounter for screening mammogram for malignant neoplasm of breast: Secondary | ICD-10-CM

## 2021-04-02 ENCOUNTER — Ambulatory Visit (INDEPENDENT_AMBULATORY_CARE_PROVIDER_SITE_OTHER): Payer: Medicare HMO

## 2021-04-02 ENCOUNTER — Ambulatory Visit: Payer: Medicare HMO | Admitting: Podiatry

## 2021-04-02 ENCOUNTER — Other Ambulatory Visit: Payer: Self-pay

## 2021-04-02 DIAGNOSIS — M778 Other enthesopathies, not elsewhere classified: Secondary | ICD-10-CM

## 2021-04-02 DIAGNOSIS — G5792 Unspecified mononeuropathy of left lower limb: Secondary | ICD-10-CM | POA: Diagnosis not present

## 2021-04-02 NOTE — Progress Notes (Signed)
°  Subjective:  Patient ID: Sheila Johnston, female    DOB: June 22, 1945,  MRN: [id number]  Chief Complaint  Patient presents with   Foot Swelling      NP left foot painful and swelling      75 y.o. female presents with the above complaint. History confirmed with patient.  She hit her foot back in late September on a stair riser while ascending stairs and fell  Objective:  Physical Exam: warm, good capillary refill, no trophic changes or ulcerative lesions, normal DP and PT pulses, and normal sensory exam. Left Foot: Tenderness and a positive Tinel sign over the deep peroneal nerve over the mid arch of the foot  Radiographs: Multiple views x-ray of the left foot: no fracture, dislocation, swelling or degenerative changes noted Assessment:   1. Neuritis of left foot      Plan:  Patient was evaluated and treated and all questions answered.  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  There is no evidence of fracture or osseous issues in the foot on her x-rays.  She has a positive Tinel sign over the deep peroneal nerve.  I recommended corticosteroid injection which we did today with 1/2 cc of lidocaine 2% plain and 1 cc of 4 mg of dexamethasone following sterile prep with alcohol.  She tolerated this well.  It was dressed with a Band-Aid.  If not improving or worsens would consider an MRI.  She will return as needed.  Return if symptoms worsen or fail to improve.

## 2021-04-08 ENCOUNTER — Other Ambulatory Visit: Payer: Self-pay

## 2021-04-08 ENCOUNTER — Ambulatory Visit
Admission: RE | Admit: 2021-04-08 | Discharge: 2021-04-08 | Disposition: A | Discharge status: Home / Self Care | Payer: Medicare HMO | Source: Ambulatory Visit | Attending: Family Medicine | Admitting: Family Medicine

## 2021-04-08 DIAGNOSIS — Z1231 Encounter for screening mammogram for malignant neoplasm of breast: Secondary | ICD-10-CM | POA: Diagnosis present

## 2021-07-23 ENCOUNTER — Telehealth: Payer: Self-pay | Admitting: Podiatry

## 2021-07-23 DIAGNOSIS — M778 Other enthesopathies, not elsewhere classified: Secondary | ICD-10-CM

## 2021-07-23 DIAGNOSIS — G5792 Unspecified mononeuropathy of left lower limb: Secondary | ICD-10-CM

## 2021-07-23 NOTE — Telephone Encounter (Signed)
Left message for pt to call Monday to [facility] to get the mri scheduled. ?

## 2021-07-23 NOTE — Telephone Encounter (Signed)
Pt returned call. She is aware to contact [facility] on Monday to schedule for MRI per Dr order. ?

## 2021-07-23 NOTE — Telephone Encounter (Signed)
Pt called stating you told her if the shot you gave her did not continue to work to call and you would get her scheduled for a mri. ? ?She said the shot worked for a few weeks but now she is having the pain again and would like to get scheduled for the mri.Sheila Johnston ?

## 2021-07-23 NOTE — Telephone Encounter (Signed)
Patient is still having pain 3.5 months after last injection helped temporarily and then wore off.  I reviewed her last x-rays again and this showed no acute osseous abnormalities in the area.  I do think this is a soft tissue issue with compression of the peroneal nerve or possible tendinitis or capsulitis in the midfoot.  I have ordered an MRI to evaluate this.  I will see her back after the MRI. ?

## 2021-08-01 ENCOUNTER — Ambulatory Visit
Admission: RE | Admit: 2021-08-01 | Discharge: 2021-08-01 | Disposition: A | Discharge status: Home / Self Care | Payer: Medicare HMO | Source: Ambulatory Visit | Attending: Podiatry | Admitting: Podiatry

## 2021-08-01 DIAGNOSIS — G5792 Unspecified mononeuropathy of left lower limb: Secondary | ICD-10-CM

## 2021-08-01 DIAGNOSIS — M778 Other enthesopathies, not elsewhere classified: Secondary | ICD-10-CM

## 2021-12-25 ENCOUNTER — Ambulatory Visit
Admission: RE | Admit: 2021-12-25 | Discharge: 2021-12-25 | Disposition: A | Discharge status: Home / Self Care | Payer: Medicare HMO | Source: Ambulatory Visit | Attending: Family Medicine | Admitting: Family Medicine

## 2021-12-25 DIAGNOSIS — Z87891 Personal history of nicotine dependence: Secondary | ICD-10-CM | POA: Insufficient documentation

## 2021-12-29 ENCOUNTER — Other Ambulatory Visit: Payer: Self-pay

## 2021-12-29 DIAGNOSIS — Z87891 Personal history of nicotine dependence: Secondary | ICD-10-CM

## 2021-12-29 DIAGNOSIS — Z122 Encounter for screening for malignant neoplasm of respiratory organs: Secondary | ICD-10-CM

## 2022-04-27 DIAGNOSIS — M5451 Vertebrogenic low back pain: Secondary | ICD-10-CM | POA: Diagnosis not present

## 2022-04-27 DIAGNOSIS — M9903 Segmental and somatic dysfunction of lumbar region: Secondary | ICD-10-CM | POA: Diagnosis not present

## 2022-04-27 DIAGNOSIS — M9904 Segmental and somatic dysfunction of sacral region: Secondary | ICD-10-CM | POA: Diagnosis not present

## 2022-04-27 DIAGNOSIS — M5441 Lumbago with sciatica, right side: Secondary | ICD-10-CM | POA: Diagnosis not present

## 2022-05-06 DIAGNOSIS — M5451 Vertebrogenic low back pain: Secondary | ICD-10-CM | POA: Diagnosis not present

## 2022-05-06 DIAGNOSIS — M5441 Lumbago with sciatica, right side: Secondary | ICD-10-CM | POA: Diagnosis not present

## 2022-05-06 DIAGNOSIS — M9904 Segmental and somatic dysfunction of sacral region: Secondary | ICD-10-CM | POA: Diagnosis not present

## 2022-05-06 DIAGNOSIS — M9903 Segmental and somatic dysfunction of lumbar region: Secondary | ICD-10-CM | POA: Diagnosis not present

## 2022-05-22 ENCOUNTER — Other Ambulatory Visit: Payer: Self-pay | Admitting: Nurse Practitioner

## 2022-05-22 DIAGNOSIS — Z833 Family history of diabetes mellitus: Secondary | ICD-10-CM | POA: Diagnosis not present

## 2022-05-22 DIAGNOSIS — Z79899 Other long term (current) drug therapy: Secondary | ICD-10-CM | POA: Diagnosis not present

## 2022-05-22 DIAGNOSIS — E039 Hypothyroidism, unspecified: Secondary | ICD-10-CM | POA: Diagnosis not present

## 2022-05-22 DIAGNOSIS — E559 Vitamin D deficiency, unspecified: Secondary | ICD-10-CM | POA: Diagnosis not present

## 2022-05-22 DIAGNOSIS — E669 Obesity, unspecified: Secondary | ICD-10-CM | POA: Diagnosis not present

## 2022-05-22 DIAGNOSIS — Z0000 Encounter for general adult medical examination without abnormal findings: Secondary | ICD-10-CM | POA: Diagnosis not present

## 2022-05-22 DIAGNOSIS — R7309 Other abnormal glucose: Secondary | ICD-10-CM | POA: Diagnosis not present

## 2022-05-22 DIAGNOSIS — Z1231 Encounter for screening mammogram for malignant neoplasm of breast: Secondary | ICD-10-CM

## 2022-05-22 DIAGNOSIS — K219 Gastro-esophageal reflux disease without esophagitis: Secondary | ICD-10-CM | POA: Diagnosis not present

## 2022-05-22 DIAGNOSIS — Z13820 Encounter for screening for osteoporosis: Secondary | ICD-10-CM | POA: Diagnosis not present

## 2022-05-22 DIAGNOSIS — Z8262 Family history of osteoporosis: Secondary | ICD-10-CM | POA: Diagnosis not present

## 2022-05-22 DIAGNOSIS — E782 Mixed hyperlipidemia: Secondary | ICD-10-CM | POA: Diagnosis not present

## 2022-06-12 ENCOUNTER — Ambulatory Visit
Admission: RE | Admit: 2022-06-12 | Discharge: 2022-06-12 | Disposition: A | Discharge status: Home / Self Care | Payer: HMO | Source: Ambulatory Visit | Attending: Nurse Practitioner | Admitting: Nurse Practitioner

## 2022-06-12 DIAGNOSIS — Z1231 Encounter for screening mammogram for malignant neoplasm of breast: Secondary | ICD-10-CM | POA: Insufficient documentation

## 2022-06-17 DIAGNOSIS — M8588 Other specified disorders of bone density and structure, other site: Secondary | ICD-10-CM | POA: Diagnosis not present

## 2022-08-21 DIAGNOSIS — E039 Hypothyroidism, unspecified: Secondary | ICD-10-CM | POA: Diagnosis not present

## 2022-08-21 DIAGNOSIS — K219 Gastro-esophageal reflux disease without esophagitis: Secondary | ICD-10-CM | POA: Diagnosis not present

## 2022-08-21 DIAGNOSIS — E669 Obesity, unspecified: Secondary | ICD-10-CM | POA: Diagnosis not present

## 2022-08-21 DIAGNOSIS — E782 Mixed hyperlipidemia: Secondary | ICD-10-CM | POA: Diagnosis not present

## 2022-08-21 DIAGNOSIS — R7302 Impaired glucose tolerance (oral): Secondary | ICD-10-CM | POA: Diagnosis not present

## 2022-08-21 DIAGNOSIS — M8580 Other specified disorders of bone density and structure, unspecified site: Secondary | ICD-10-CM | POA: Diagnosis not present

## 2022-08-21 DIAGNOSIS — Z79899 Other long term (current) drug therapy: Secondary | ICD-10-CM | POA: Diagnosis not present

## 2022-08-21 DIAGNOSIS — I700 Atherosclerosis of aorta: Secondary | ICD-10-CM | POA: Diagnosis not present

## 2022-12-08 DIAGNOSIS — M9903 Segmental and somatic dysfunction of lumbar region: Secondary | ICD-10-CM | POA: Diagnosis not present

## 2022-12-08 DIAGNOSIS — M5451 Vertebrogenic low back pain: Secondary | ICD-10-CM | POA: Diagnosis not present

## 2022-12-08 DIAGNOSIS — M5441 Lumbago with sciatica, right side: Secondary | ICD-10-CM | POA: Diagnosis not present

## 2022-12-08 DIAGNOSIS — M9904 Segmental and somatic dysfunction of sacral region: Secondary | ICD-10-CM | POA: Diagnosis not present

## 2022-12-22 DIAGNOSIS — R7302 Impaired glucose tolerance (oral): Secondary | ICD-10-CM | POA: Diagnosis not present

## 2022-12-22 DIAGNOSIS — E669 Obesity, unspecified: Secondary | ICD-10-CM | POA: Diagnosis not present

## 2022-12-22 DIAGNOSIS — E782 Mixed hyperlipidemia: Secondary | ICD-10-CM | POA: Diagnosis not present

## 2022-12-22 DIAGNOSIS — I700 Atherosclerosis of aorta: Secondary | ICD-10-CM | POA: Diagnosis not present

## 2022-12-22 DIAGNOSIS — K219 Gastro-esophageal reflux disease without esophagitis: Secondary | ICD-10-CM | POA: Diagnosis not present

## 2022-12-22 DIAGNOSIS — E039 Hypothyroidism, unspecified: Secondary | ICD-10-CM | POA: Diagnosis not present

## 2022-12-22 DIAGNOSIS — Z122 Encounter for screening for malignant neoplasm of respiratory organs: Secondary | ICD-10-CM | POA: Diagnosis not present

## 2022-12-22 DIAGNOSIS — Z79899 Other long term (current) drug therapy: Secondary | ICD-10-CM | POA: Diagnosis not present

## 2022-12-22 DIAGNOSIS — Z87891 Personal history of nicotine dependence: Secondary | ICD-10-CM | POA: Diagnosis not present

## 2022-12-23 DIAGNOSIS — M5451 Vertebrogenic low back pain: Secondary | ICD-10-CM | POA: Diagnosis not present

## 2022-12-23 DIAGNOSIS — M9903 Segmental and somatic dysfunction of lumbar region: Secondary | ICD-10-CM | POA: Diagnosis not present

## 2022-12-23 DIAGNOSIS — M5441 Lumbago with sciatica, right side: Secondary | ICD-10-CM | POA: Diagnosis not present

## 2022-12-23 DIAGNOSIS — M9904 Segmental and somatic dysfunction of sacral region: Secondary | ICD-10-CM | POA: Diagnosis not present

## 2022-12-28 ENCOUNTER — Ambulatory Visit
Admission: RE | Admit: 2022-12-28 | Discharge: 2022-12-28 | Disposition: A | Discharge status: Home / Self Care | Payer: HMO | Source: Ambulatory Visit | Attending: Family Medicine | Admitting: Family Medicine

## 2022-12-28 DIAGNOSIS — Z122 Encounter for screening for malignant neoplasm of respiratory organs: Secondary | ICD-10-CM | POA: Diagnosis not present

## 2022-12-28 DIAGNOSIS — Z87891 Personal history of nicotine dependence: Secondary | ICD-10-CM | POA: Diagnosis not present

## 2023-01-12 DIAGNOSIS — M9903 Segmental and somatic dysfunction of lumbar region: Secondary | ICD-10-CM | POA: Diagnosis not present

## 2023-01-12 DIAGNOSIS — M9904 Segmental and somatic dysfunction of sacral region: Secondary | ICD-10-CM | POA: Diagnosis not present

## 2023-01-12 DIAGNOSIS — M5451 Vertebrogenic low back pain: Secondary | ICD-10-CM | POA: Diagnosis not present

## 2023-01-12 DIAGNOSIS — M5441 Lumbago with sciatica, right side: Secondary | ICD-10-CM | POA: Diagnosis not present

## 2023-01-28 DIAGNOSIS — R58 Hemorrhage, not elsewhere classified: Secondary | ICD-10-CM | POA: Diagnosis not present

## 2023-01-28 DIAGNOSIS — D2271 Melanocytic nevi of right lower limb, including hip: Secondary | ICD-10-CM | POA: Diagnosis not present

## 2023-01-28 DIAGNOSIS — R208 Other disturbances of skin sensation: Secondary | ICD-10-CM | POA: Diagnosis not present

## 2023-01-28 DIAGNOSIS — D2261 Melanocytic nevi of right upper limb, including shoulder: Secondary | ICD-10-CM | POA: Diagnosis not present

## 2023-01-28 DIAGNOSIS — D225 Melanocytic nevi of trunk: Secondary | ICD-10-CM | POA: Diagnosis not present

## 2023-01-28 DIAGNOSIS — L820 Inflamed seborrheic keratosis: Secondary | ICD-10-CM | POA: Diagnosis not present

## 2023-01-28 DIAGNOSIS — L821 Other seborrheic keratosis: Secondary | ICD-10-CM | POA: Diagnosis not present

## 2023-01-28 DIAGNOSIS — D2262 Melanocytic nevi of left upper limb, including shoulder: Secondary | ICD-10-CM | POA: Diagnosis not present

## 2023-01-28 DIAGNOSIS — D2272 Melanocytic nevi of left lower limb, including hip: Secondary | ICD-10-CM | POA: Diagnosis not present

## 2023-04-28 DIAGNOSIS — H2513 Age-related nuclear cataract, bilateral: Secondary | ICD-10-CM | POA: Diagnosis not present

## 2023-05-06 DIAGNOSIS — H2511 Age-related nuclear cataract, right eye: Secondary | ICD-10-CM | POA: Diagnosis not present

## 2023-05-06 DIAGNOSIS — H2512 Age-related nuclear cataract, left eye: Secondary | ICD-10-CM | POA: Diagnosis not present

## 2023-05-06 DIAGNOSIS — H35373 Puckering of macula, bilateral: Secondary | ICD-10-CM | POA: Diagnosis not present

## 2023-05-06 DIAGNOSIS — H04123 Dry eye syndrome of bilateral lacrimal glands: Secondary | ICD-10-CM | POA: Diagnosis not present

## 2023-05-24 ENCOUNTER — Encounter: Payer: Self-pay | Admitting: Ophthalmology

## 2023-05-25 DIAGNOSIS — H2512 Age-related nuclear cataract, left eye: Secondary | ICD-10-CM | POA: Diagnosis not present

## 2023-06-07 DIAGNOSIS — R350 Frequency of micturition: Secondary | ICD-10-CM | POA: Diagnosis not present

## 2023-06-07 DIAGNOSIS — R399 Unspecified symptoms and signs involving the genitourinary system: Secondary | ICD-10-CM | POA: Diagnosis not present

## 2023-06-07 DIAGNOSIS — R300 Dysuria: Secondary | ICD-10-CM | POA: Diagnosis not present

## 2023-06-14 NOTE — Anesthesia Preprocedure Evaluation (Signed)
 Anesthesia Evaluation  Patient identified by MRN, date of birth, ID band Patient awake    Reviewed: Allergy & Precautions, H&P , NPO status , Patient's Chart, lab work & pertinent test results  Airway Mallampati: IV  TM Distance: <3 FB Neck ROM: Full    Dental no notable dental hx. (+) Caps   Pulmonary neg pulmonary ROS, former smoker   Pulmonary exam normal breath sounds clear to auscultation       Cardiovascular negative cardio ROS Normal cardiovascular exam Rhythm:Regular Rate:Normal     Neuro/Psych negative neurological ROS  negative psych ROS   GI/Hepatic negative GI ROS, Neg liver ROS,GERD  ,,  Endo/Other  negative endocrine ROSHypothyroidism    Renal/GU negative Renal ROS  negative genitourinary   Musculoskeletal negative musculoskeletal ROS (+) Arthritis ,    Abdominal   Peds negative pediatric ROS (+)  Hematology negative hematology ROS (+)   Anesthesia Other Findings Arthritis GERD hypothyroidism  Reproductive/Obstetrics negative OB ROS                              Anesthesia Physical Anesthesia Plan  ASA: 2  Anesthesia Plan: MAC   Post-op Pain Management:    Induction: Intravenous  PONV Risk Score and Plan:   Airway Management Planned: Natural Airway and Nasal Cannula  Additional Equipment:   Intra-op Plan:   Post-operative Plan:   Informed Consent: I have reviewed the patients History and Physical, chart, labs and discussed the procedure including the risks, benefits and alternatives for the proposed anesthesia with the patient or authorized representative who has indicated his/her understanding and acceptance.     Dental Advisory Given  Plan Discussed with: Anesthesiologist, CRNA and Surgeon  Anesthesia Plan Comments: (Patient consented for risks of anesthesia including but not limited to:  - adverse reactions to medications - damage to eyes, teeth,  lips or other oral mucosa - nerve damage due to positioning  - sore throat or hoarseness - Damage to heart, brain, nerves, lungs, other parts of body or loss of life  Patient voiced understanding and assent.)         Anesthesia Quick Evaluation

## 2023-06-14 NOTE — Discharge Instructions (Signed)

## 2023-06-16 ENCOUNTER — Encounter: Payer: Self-pay | Admitting: Ophthalmology

## 2023-06-16 ENCOUNTER — Ambulatory Visit: Payer: Self-pay | Admitting: Anesthesiology

## 2023-06-16 ENCOUNTER — Ambulatory Visit
Admission: RE | Admit: 2023-06-16 | Discharge: 2023-06-16 | Disposition: A | Discharge status: Home / Self Care | Payer: HMO | Attending: Ophthalmology | Admitting: Ophthalmology

## 2023-06-16 ENCOUNTER — Other Ambulatory Visit: Payer: Self-pay

## 2023-06-16 ENCOUNTER — Encounter
Admission: RE | Disposition: A | Discharge status: Home / Self Care | Payer: Self-pay | Source: Home / Self Care | Attending: Ophthalmology

## 2023-06-16 DIAGNOSIS — E039 Hypothyroidism, unspecified: Secondary | ICD-10-CM | POA: Diagnosis not present

## 2023-06-16 DIAGNOSIS — H2512 Age-related nuclear cataract, left eye: Secondary | ICD-10-CM | POA: Insufficient documentation

## 2023-06-16 DIAGNOSIS — H269 Unspecified cataract: Secondary | ICD-10-CM | POA: Diagnosis not present

## 2023-06-16 DIAGNOSIS — Z87891 Personal history of nicotine dependence: Secondary | ICD-10-CM | POA: Insufficient documentation

## 2023-06-16 DIAGNOSIS — K219 Gastro-esophageal reflux disease without esophagitis: Secondary | ICD-10-CM | POA: Diagnosis not present

## 2023-06-16 DIAGNOSIS — E782 Mixed hyperlipidemia: Secondary | ICD-10-CM | POA: Diagnosis not present

## 2023-06-16 HISTORY — PX: CATARACT EXTRACTION W/PHACO: SHX586

## 2023-06-16 SURGERY — PHACOEMULSIFICATION, CATARACT, WITH IOL INSERTION
Anesthesia: Monitor Anesthesia Care | Site: Eye | Laterality: Left

## 2023-06-16 MED ORDER — BRIMONIDINE TARTRATE-TIMOLOL 0.2-0.5 % OP SOLN
OPHTHALMIC | Status: DC | PRN
  Administered 2023-06-16: 1 [drp] via OPHTHALMIC

## 2023-06-16 MED ORDER — TETRACAINE HCL 0.5 % OP SOLN
OPHTHALMIC | Status: AC
  Filled 2023-06-16: qty 4

## 2023-06-16 MED ORDER — MIDAZOLAM HCL 2 MG/2ML IJ SOLN
INTRAMUSCULAR | Status: AC
  Filled 2023-06-16: qty 2

## 2023-06-16 MED ORDER — SIGHTPATH DOSE#1 BSS IO SOLN
INTRAOCULAR | Status: DC | PRN
  Administered 2023-06-16: 1 mL

## 2023-06-16 MED ORDER — ARMC OPHTHALMIC DILATING DROPS
OPHTHALMIC | Status: AC
  Filled 2023-06-16: qty 0.5

## 2023-06-16 MED ORDER — CEFUROXIME OPHTHALMIC INJECTION 1 MG/0.1 ML
INJECTION | OPHTHALMIC | Status: DC | PRN
  Administered 2023-06-16: .1 mL via INTRACAMERAL

## 2023-06-16 MED ORDER — FENTANYL CITRATE (PF) 100 MCG/2ML IJ SOLN
INTRAMUSCULAR | Status: AC
  Filled 2023-06-16: qty 2

## 2023-06-16 MED ORDER — SIGHTPATH DOSE#1 BSS IO SOLN
INTRAOCULAR | Status: DC | PRN
  Administered 2023-06-16: 79 mL via OPHTHALMIC

## 2023-06-16 MED ORDER — SIGHTPATH DOSE#1 BSS IO SOLN
INTRAOCULAR | Status: DC | PRN
  Administered 2023-06-16: 15 mL

## 2023-06-16 MED ORDER — SIGHTPATH DOSE#1 NA HYALUR & NA CHOND-NA HYALUR IO KIT
PACK | INTRAOCULAR | Status: DC | PRN
  Administered 2023-06-16: 1 via OPHTHALMIC

## 2023-06-16 MED ORDER — FENTANYL CITRATE (PF) 100 MCG/2ML IJ SOLN
INTRAMUSCULAR | Status: DC | PRN
Start: 2023-06-16 — End: 2023-06-16
  Administered 2023-06-16: 50 ug via INTRAVENOUS

## 2023-06-16 MED ORDER — ARMC OPHTHALMIC DILATING DROPS
1.0000 | OPHTHALMIC | Status: DC | PRN
  Administered 2023-06-16 (×3): 1 via OPHTHALMIC

## 2023-06-16 MED ORDER — TETRACAINE HCL 0.5 % OP SOLN
1.0000 [drp] | OPHTHALMIC | Status: DC | PRN
Start: 2023-06-16 — End: 2023-06-16
  Administered 2023-06-16 (×3): 1 [drp] via OPHTHALMIC

## 2023-06-16 MED ORDER — MIDAZOLAM HCL 2 MG/2ML IJ SOLN
INTRAMUSCULAR | Status: DC | PRN
  Administered 2023-06-16: 1 mg via INTRAVENOUS

## 2023-06-16 SURGICAL SUPPLY — 20 items
BNDG EYE OVAL 2 1/8 X 2 5/8 (GAUZE/BANDAGES/DRESSINGS) IMPLANT
CANNULA ANT/CHMB 27G (MISCELLANEOUS) IMPLANT
CANNULA ANT/CHMB 27GA (MISCELLANEOUS) IMPLANT
CATARACT SUITE SIGHTPATH (MISCELLANEOUS) ×1 IMPLANT
FEE CATARACT SUITE SIGHTPATH (MISCELLANEOUS) ×1 IMPLANT
GLOVE BIOGEL PI IND STRL 8 (GLOVE) ×1 IMPLANT
GLOVE PI ULTRA LF STRL 7.5 (GLOVE) IMPLANT
GLOVE SURG LX STRL 7.5 STRW (GLOVE) ×1 IMPLANT
GLOVE SURG PROTEXIS BL SZ6.5 (GLOVE) ×1 IMPLANT
GLOVE SURG SYN 6.5 PF PI BL (GLOVE) ×1 IMPLANT
LENS IOL TECNIS EYHANCE 22.0 (Intraocular Lens) IMPLANT
NDL FILTER BLUNT 18X1 1/2 (NEEDLE) ×1 IMPLANT
NDL RETROBULBAR .5 NSTRL (NEEDLE) IMPLANT
NEEDLE FILTER BLUNT 18X1 1/2 (NEEDLE) ×1 IMPLANT
PACK VIT ANT 23G (MISCELLANEOUS) IMPLANT
RING MALYGIN 7.0 (MISCELLANEOUS) IMPLANT
SUT ETHILON 10-0 CS-B-6CS-B-6 (SUTURE) IMPLANT
SUT VICRYL 9 0 (SUTURE) IMPLANT
SUTURE EHLN 10-0 CS-B-6CS-B-6 (SUTURE) IMPLANT
SYR 3ML LL SCALE MARK (SYRINGE) ×1 IMPLANT

## 2023-06-16 NOTE — Op Note (Signed)
 OPERATIVE NOTE  Sheila Johnston [id number] 06/16/2023   PREOPERATIVE DIAGNOSIS:  Nuclear sclerotic cataract left eye. H25.12   POSTOPERATIVE DIAGNOSIS:    Nuclear sclerotic cataract left eye.     PROCEDURE:  Phacoemusification with posterior chamber intraocular lens placement of the left eye  Ultrasound time: Procedure(s) with comments: CATARACT EXTRACTION PHACO AND INTRAOCULAR LENS PLACEMENT (IOC) LEFT (Left) - 6.88 0:33.1  LENS:   Implant Name Type Inv. Item Serial No. Manufacturer Lot No. LRB No. Used Action  LENS IOL TECNIS EYHANCE 22.0 - [id number] Intraocular Lens LENS IOL TECNIS EYHANCE 22.0 [id number] [employer]  Left 1 Implanted      SURGEON:  Deirdre Evener, MD   ANESTHESIA:  Topical with tetracaine drops and 2% Xylocaine jelly, augmented with 1% preservative-free intracameral lidocaine.    COMPLICATIONS:  None.   DESCRIPTION OF PROCEDURE:  The patient was identified in the holding room and transported to the operating room and placed in the supine position under the operating microscope.  The left eye was identified as the operative eye and it was prepped and draped in the usual sterile ophthalmic fashion.   A 1 millimeter clear-corneal paracentesis was made at the 1:30 position.  0.5 ml of preservative-free 1% lidocaine was injected into the anterior chamber.  The anterior chamber was filled with Viscoat viscoelastic.  A 2.4 millimeter keratome was used to make a near-clear corneal incision at the 10:30 position.  .  A curvilinear capsulorrhexis was made with a cystotome and capsulorrhexis forceps.  Balanced salt solution was used to hydrodissect and hydrodelineate the nucleus.   Phacoemulsification was then used in stop and chop fashion to remove the lens nucleus and epinucleus.  The remaining cortex was then removed using the irrigation and aspiration handpiece. Provisc was then placed into the capsular bag to distend it for lens placement.  A lens was then injected  into the capsular bag.  The remaining viscoelastic was aspirated.   Wounds were hydrated with balanced salt solution.  The anterior chamber was inflated to a physiologic pressure with balanced salt solution.  No wound leaks were noted. Cefuroxime 0.1 ml of a 10mg /ml solution was injected into the anterior chamber for a dose of 1 mg of intracameral antibiotic at the completion of the case.   Timolol and Brimonidine drops were applied to the eye.  The patient was taken to the recovery room in stable condition without complications of anesthesia or surgery.  [name] 06/16/2023, 9:11 AM

## 2023-06-16 NOTE — Transfer of Care (Signed)
 Immediate Anesthesia Transfer of Care Note  Patient: Sheila Johnston  Procedure(s) Performed: CATARACT EXTRACTION PHACO AND INTRAOCULAR LENS PLACEMENT (IOC) LEFT (Left: Eye)  Patient Location: PACU  Anesthesia Type: MAC  Level of Consciousness: awake, alert  and patient cooperative  Airway and Oxygen Therapy: Patient Spontanous Breathing and Patient connected to supplemental oxygen  Post-op Assessment: Post-op Vital signs reviewed, Patient's Cardiovascular Status Stable, Respiratory Function Stable, Patent Airway and No signs of Nausea or vomiting  Post-op Vital Signs: Reviewed and stable  Complications: No notable events documented.

## 2023-06-16 NOTE — H&P (Signed)
 [facility]   Primary Care Physician:  [facility] Ophthalmologist: Dr. Lockie Mola  Pre-Procedure History & Physical: HPI:  Sheila Johnston is a 78 y.o. female here for ophthalmic surgery.   Past Medical History:  Diagnosis Date   Arthritis    GERD (gastroesophageal reflux disease)    Hypothyroidism     Past Surgical History:  Procedure Laterality Date   ABDOMINAL HYSTERECTOMY     CHOLECYSTECTOMY     COLONOSCOPY WITH PROPOFOL N/A 07/29/2015   Procedure: COLONOSCOPY WITH PROPOFOL;  Surgeon: Wallace Cullens, MD;  Location: [facility] ENDOSCOPY;  Service: Gastroenterology;  Laterality: N/A;    Prior to Admission medications   Medication Sig Start Date End Date Taking? Authorizing Provider  Cholecalciferol 125 MCG (5000 UT) TABS Take 1 tablet by mouth daily.   Yes [provider]  fluticasone (FLONASE) 50 MCG/ACT nasal spray Place 2 sprays into both nostrils daily.   Yes [provider]  levothyroxine (SYNTHROID, LEVOTHROID) 75 MCG tablet Take 75 mcg by mouth daily before breakfast.   Yes [provider]  pantoprazole (PROTONIX) 40 MG tablet Take 40 mg by mouth daily.   Yes [provider]  rosuvastatin (CRESTOR) 5 MG tablet Take 5 mg by mouth daily.   Yes [provider]  Calcium Ascorbate 500 MG TABS Take 500 tablets by mouth daily. Reported on 07/29/2015 Patient not taking: Reported on 06/08/2023    [provider]  Cyanocobalamin (VITAMIN B 12) 250 MCG LOZG Take 2 tablets by mouth daily. Patient not taking: Reported on 06/08/2023    [provider]  Omega-3 Fatty Acids (OCEAN BLUE MINICAPS OMEGA-3) 350 MG CAPS Take by mouth. Patient not taking: Reported on 06/08/2023    [provider]  vitamin B-12 (CYANOCOBALAMIN) 500 MCG tablet Take by mouth. Patient not taking: Reported on 06/08/2023    [provider]    Allergies as of 05/10/2023   (No Known Allergies)    Family History  Problem  Relation Age of Onset   Breast cancer Neg Hx     Social History   Socioeconomic History   Marital status: Divorced    Spouse name: Not on file   Number of children: Not on file   Years of education: Not on file   Highest education level: Not on file  Occupational History   Not on file  Tobacco Use   Smoking status: Former    Current packs/day: 0.00    Average packs/day: 1 pack/day for 44.0 years (44.0 ttl pk-yrs)    Types: Cigarettes    Start date: 04/14/1963    Quit date: 04/14/2007    Years since quitting: 16.1   Smokeless tobacco: Not on file  Substance and Sexual Activity   Alcohol use: No   Drug use: No   Sexual activity: Not on file  Other Topics Concern   Not on file  Social History Narrative   Not on file   Social Drivers of Health   Financial Resource Strain: Low Risk  (12/17/2022)   Received from [facility]   Overall Financial Resource Strain (CARDIA)    Difficulty of Paying Living Expenses: Not hard at all  Food Insecurity: No Food Insecurity (12/17/2022)   Received from [facility]   Hunger Vital Sign    Worried About Running Out of Food in the Last Year: Never true    Ran Out of Food in the Last Year: Never true  Transportation Needs: No  Transportation Needs (12/17/2022)   Received from [facility] - Transportation    In the past 12 months, has lack of transportation kept you from medical appointments or from getting medications?: No    Lack of Transportation (Non-Medical): No  Physical Activity: Not on file  Stress: Not on file  Social Connections: Not on file  Intimate Partner Violence: Not on file    Review of Systems: See HPI, otherwise negative ROS  Physical Exam: BP 133/81   Pulse 87   Temp (!) 97.4 F (36.3 C) (Temporal)   Resp 14   Ht 5\' 3"  (1.6 m)   Wt 71.7 kg   SpO2 97%   BMI 27.99 kg/m  General:   Alert,  pleasant and cooperative in NAD Head:  Normocephalic and  atraumatic. Lungs:  Clear to auscultation.    Heart:  Regular rate and rhythm.   Impression/Plan: Sheila Johnston is here for ophthalmic surgery.  Risks, benefits, limitations, and alternatives regarding ophthalmic surgery have been reviewed with the patient.  Questions have been answered.  All parties agreeable.   Lockie Mola, MD  06/16/2023, 8:27 AM

## 2023-06-16 NOTE — Anesthesia Postprocedure Evaluation (Signed)
 Anesthesia Post Note  Patient: Sheila Johnston  Procedure(s) Performed: CATARACT EXTRACTION PHACO AND INTRAOCULAR LENS PLACEMENT (IOC) LEFT (Left: Eye)  Patient location during evaluation: PACU Anesthesia Type: MAC Level of consciousness: awake and alert Pain management: pain level controlled Vital Signs Assessment: post-procedure vital signs reviewed and stable Respiratory status: spontaneous breathing, nonlabored ventilation, respiratory function stable and patient connected to nasal cannula oxygen Cardiovascular status: stable and blood pressure returned to baseline Postop Assessment: no apparent nausea or vomiting Anesthetic complications: no   No notable events documented.   Last Vitals:  Vitals:   06/16/23 0914 06/16/23 0916  BP:  121/64  Pulse: 82 85  Resp: 13 17  Temp:    SpO2: 96% 98%    Last Pain:  Vitals:   06/16/23 0916  TempSrc:   PainSc: 0-No pain                 Bevely Palmer Nyiesha Beever

## 2023-06-17 ENCOUNTER — Encounter: Payer: Self-pay | Admitting: Ophthalmology

## 2023-06-17 ENCOUNTER — Other Ambulatory Visit: Payer: Self-pay

## 2023-06-17 DIAGNOSIS — H2511 Age-related nuclear cataract, right eye: Secondary | ICD-10-CM | POA: Diagnosis not present

## 2023-06-25 NOTE — Anesthesia Preprocedure Evaluation (Addendum)
 Anesthesia Evaluation  Patient identified by MRN, date of birth, ID band Patient awake    Reviewed: Allergy & Precautions, H&P , NPO status , Patient's Chart, lab work & pertinent test results  Airway Mallampati: IV  TM Distance: <3 FB Neck ROM: Full    Dental no notable dental hx. (+) Caps   Pulmonary former smoker   Pulmonary exam normal breath sounds clear to auscultation       Cardiovascular negative cardio ROS Normal cardiovascular exam Rhythm:Regular Rate:Normal     Neuro/Psych negative neurological ROS  negative psych ROS   GI/Hepatic negative GI ROS, Neg liver ROS,GERD  ,,  Endo/Other  negative endocrine ROSHypothyroidism    Renal/GU negative Renal ROS  negative genitourinary   Musculoskeletal negative musculoskeletal ROS (+) Arthritis ,    Abdominal   Peds negative pediatric ROS (+)  Hematology negative hematology ROS (+)   Anesthesia Other Findings Previous cataract surgery 06-16-23 Dr. Juel Burrow anesthesiologist  Arthritis Hypothyroidism GERD   Reproductive/Obstetrics negative OB ROS                              Anesthesia Physical Anesthesia Plan  ASA: 2  Anesthesia Plan: MAC   Post-op Pain Management:    Induction: Intravenous  PONV Risk Score and Plan:   Airway Management Planned: Natural Airway and Nasal Cannula  Additional Equipment:   Intra-op Plan:   Post-operative Plan:   Informed Consent: I have reviewed the patients History and Physical, chart, labs and discussed the procedure including the risks, benefits and alternatives for the proposed anesthesia with the patient or authorized representative who has indicated his/her understanding and acceptance.     Dental Advisory Given  Plan Discussed with: Anesthesiologist, CRNA and Surgeon  Anesthesia Plan Comments: (Patient consented for risks of anesthesia including but not limited to:  - adverse  reactions to medications - damage to eyes, teeth, lips or other oral mucosa - nerve damage due to positioning  - sore throat or hoarseness - Damage to heart, brain, nerves, lungs, other parts of body or loss of life  Patient voiced understanding and assent.)         Anesthesia Quick Evaluation

## 2023-06-29 NOTE — Discharge Instructions (Signed)

## 2023-06-30 ENCOUNTER — Encounter: Payer: Self-pay | Admitting: Ophthalmology

## 2023-06-30 ENCOUNTER — Other Ambulatory Visit: Payer: Self-pay

## 2023-06-30 ENCOUNTER — Ambulatory Visit: Payer: Self-pay | Admitting: Anesthesiology

## 2023-06-30 ENCOUNTER — Encounter
Admission: RE | Disposition: A | Discharge status: Home / Self Care | Payer: Self-pay | Source: Home / Self Care | Attending: Ophthalmology

## 2023-06-30 ENCOUNTER — Ambulatory Visit
Admission: RE | Admit: 2023-06-30 | Discharge: 2023-06-30 | Disposition: A | Discharge status: Home / Self Care | Payer: HMO | Attending: Ophthalmology | Admitting: Ophthalmology

## 2023-06-30 DIAGNOSIS — H2511 Age-related nuclear cataract, right eye: Secondary | ICD-10-CM | POA: Insufficient documentation

## 2023-06-30 DIAGNOSIS — K219 Gastro-esophageal reflux disease without esophagitis: Secondary | ICD-10-CM | POA: Insufficient documentation

## 2023-06-30 DIAGNOSIS — Z87891 Personal history of nicotine dependence: Secondary | ICD-10-CM | POA: Diagnosis not present

## 2023-06-30 DIAGNOSIS — E039 Hypothyroidism, unspecified: Secondary | ICD-10-CM | POA: Insufficient documentation

## 2023-06-30 DIAGNOSIS — H269 Unspecified cataract: Secondary | ICD-10-CM | POA: Diagnosis not present

## 2023-06-30 HISTORY — PX: CATARACT EXTRACTION W/PHACO: SHX586

## 2023-06-30 SURGERY — PHACOEMULSIFICATION, CATARACT, WITH IOL INSERTION
Anesthesia: Monitor Anesthesia Care | Laterality: Right

## 2023-06-30 MED ORDER — MIDAZOLAM HCL 2 MG/2ML IJ SOLN
INTRAMUSCULAR | Status: DC | PRN
  Administered 2023-06-30 (×2): 1 mg via INTRAVENOUS

## 2023-06-30 MED ORDER — SIGHTPATH DOSE#1 BSS IO SOLN
INTRAOCULAR | Status: DC | PRN
  Administered 2023-06-30: 15 mL via INTRAOCULAR

## 2023-06-30 MED ORDER — MIDAZOLAM HCL 2 MG/2ML IJ SOLN
INTRAMUSCULAR | Status: AC
  Filled 2023-06-30: qty 2

## 2023-06-30 MED ORDER — BRIMONIDINE TARTRATE-TIMOLOL 0.2-0.5 % OP SOLN
OPHTHALMIC | Status: DC | PRN
  Administered 2023-06-30: 1 [drp] via OPHTHALMIC

## 2023-06-30 MED ORDER — LIDOCAINE HCL (PF) 2 % IJ SOLN
INTRAMUSCULAR | Status: DC | PRN
  Administered 2023-06-30: 1 mL

## 2023-06-30 MED ORDER — FENTANYL CITRATE (PF) 100 MCG/2ML IJ SOLN
INTRAMUSCULAR | Status: DC | PRN
  Administered 2023-06-30: 50 ug via INTRAVENOUS

## 2023-06-30 MED ORDER — SIGHTPATH DOSE#1 BSS IO SOLN
INTRAOCULAR | Status: DC | PRN
  Administered 2023-06-30: 46 mL via OPHTHALMIC

## 2023-06-30 MED ORDER — SIGHTPATH DOSE#1 NA HYALUR & NA CHOND-NA HYALUR IO KIT
PACK | INTRAOCULAR | Status: DC | PRN
  Administered 2023-06-30: 1 via OPHTHALMIC

## 2023-06-30 MED ORDER — FENTANYL CITRATE (PF) 100 MCG/2ML IJ SOLN
INTRAMUSCULAR | Status: AC
  Filled 2023-06-30: qty 2

## 2023-06-30 MED ORDER — CEFUROXIME OPHTHALMIC INJECTION 1 MG/0.1 ML
INJECTION | OPHTHALMIC | Status: DC | PRN
  Administered 2023-06-30: .1 mL via INTRACAMERAL

## 2023-06-30 MED ORDER — ARMC OPHTHALMIC DILATING DROPS
OPHTHALMIC | Status: AC
  Filled 2023-06-30: qty 0.5

## 2023-06-30 MED ORDER — ARMC OPHTHALMIC DILATING DROPS
1.0000 | OPHTHALMIC | Status: DC | PRN
  Administered 2023-06-30 (×3): 1 via OPHTHALMIC

## 2023-06-30 MED ORDER — TETRACAINE HCL 0.5 % OP SOLN
OPHTHALMIC | Status: AC
  Filled 2023-06-30: qty 4

## 2023-06-30 MED ORDER — TETRACAINE HCL 0.5 % OP SOLN
1.0000 [drp] | OPHTHALMIC | Status: DC | PRN
  Administered 2023-06-30 (×3): 1 [drp] via OPHTHALMIC

## 2023-06-30 SURGICAL SUPPLY — 10 items
CATARACT SUITE SIGHTPATH (MISCELLANEOUS) ×1 IMPLANT
FEE CATARACT SUITE SIGHTPATH (MISCELLANEOUS) ×1 IMPLANT
GLOVE BIOGEL PI IND STRL 8 (GLOVE) ×1 IMPLANT
GLOVE SURG LX STRL 7.5 STRW (GLOVE) ×1 IMPLANT
GLOVE SURG PROTEXIS BL SZ6.5 (GLOVE) ×1 IMPLANT
GLOVE SURG SYN 6.5 PF PI BL (GLOVE) ×1 IMPLANT
LENS IOL TECNIS EYHANCE 21.5 (Intraocular Lens) IMPLANT
NDL FILTER BLUNT 18X1 1/2 (NEEDLE) ×1 IMPLANT
NEEDLE FILTER BLUNT 18X1 1/2 (NEEDLE) ×1 IMPLANT
SYR 3ML LL SCALE MARK (SYRINGE) ×1 IMPLANT

## 2023-06-30 NOTE — Transfer of Care (Signed)
 Immediate Anesthesia Transfer of Care Note  Patient: Sheila Johnston  Procedure(s) Performed: CATARACT EXTRACTION PHACO AND INTRAOCULAR LENS PLACEMENT (IOC) RIGHT 4.08, 00:26.7 (Right)  Patient Location: PACU  Anesthesia Type: MAC  Level of Consciousness: awake, alert  and patient cooperative  Airway and Oxygen Therapy: Patient Spontanous Breathing and Patient connected to supplemental oxygen  Post-op Assessment: Post-op Vital signs reviewed, Patient's Cardiovascular Status Stable, Respiratory Function Stable, Patent Airway and No signs of Nausea or vomiting  Post-op Vital Signs: Reviewed and stable  Complications: No notable events documented.

## 2023-06-30 NOTE — Anesthesia Postprocedure Evaluation (Signed)
 Anesthesia Post Note  Patient: Sheila Johnston  Procedure(s) Performed: CATARACT EXTRACTION PHACO AND INTRAOCULAR LENS PLACEMENT (IOC) RIGHT 4.08, 00:26.7 (Right)  Patient location during evaluation: PACU Anesthesia Type: MAC Level of consciousness: awake and alert Pain management: pain level controlled Vital Signs Assessment: post-procedure vital signs reviewed and stable Respiratory status: spontaneous breathing, nonlabored ventilation, respiratory function stable and patient connected to nasal cannula oxygen Cardiovascular status: stable and blood pressure returned to baseline Postop Assessment: no apparent nausea or vomiting Anesthetic complications: no   No notable events documented.   Last Vitals:  Vitals:   06/30/23 1204 06/30/23 1209  BP: 108/66 109/62  Pulse: 77 83  Resp: (!) 22 17  Temp: (!) 36.2 C (!) 36.2 C  SpO2: 100% 98%    Last Pain:  Vitals:   06/30/23 1209  TempSrc:   PainSc: 0-No pain                 Anastaisa Wooding C Delvina Mizzell

## 2023-06-30 NOTE — H&P (Signed)
  LOCATION:  [facility]   PREOPERATIVE DIAGNOSIS:    Nuclear sclerotic cataract right eye. H25.11   POSTOPERATIVE DIAGNOSIS:  Nuclear sclerotic cataract right eye.     PROCEDURE:  Phacoemusification with posterior chamber intraocular lens placement of the right eye   ULTRASOUND TIME: Procedure(s): CATARACT EXTRACTION PHACO AND INTRAOCULAR LENS PLACEMENT (IOC) RIGHT 4.08, 00:26.7 (Right)  LENS:   Implant Name Type Inv. Item Serial No. Manufacturer Lot No. LRB No. Used Action  LENS IOL TECNIS EYHANCE 21.5 - [id number] Intraocular Lens LENS IOL TECNIS EYHANCE 21.5 [id number] [employer]  Right 1 Implanted         SURGEON:  Deirdre Evener, MD   ANESTHESIA:  Topical with tetracaine drops and 2% Xylocaine jelly, augmented with 1% preservative-free intracameral lidocaine.    COMPLICATIONS:  None.   DESCRIPTION OF PROCEDURE:  The patient was identified in the holding room and transported to the operating room and placed in the supine position under the operating microscope.  The right eye was identified as the operative eye and it was prepped and draped in the usual sterile ophthalmic fashion.   A 1 millimeter clear-corneal paracentesis was made at the 12:00 position.  0.5 ml of preservative-free 1% lidocaine was injected into the anterior chamber. The anterior chamber was filled with Viscoat viscoelastic.  A 2.4 millimeter keratome was used to make a near-clear corneal incision at the 9:00 position.  A curvilinear capsulorrhexis was made with a cystotome and capsulorrhexis forceps.  Balanced salt solution was used to hydrodissect and hydrodelineate the nucleus.   Phacoemulsification was then used in stop and chop fashion to remove the lens nucleus and epinucleus.  The remaining cortex was then removed using the irrigation and aspiration handpiece. Provisc was then placed into the capsular bag to distend it for lens placement.  A lens was then injected into the capsular bag.   The remaining viscoelastic was aspirated.   Wounds were hydrated with balanced salt solution.  The anterior chamber was inflated to a physiologic pressure with balanced salt solution.  No wound leaks were noted. Cefuroxime 0.1 ml of a 10mg /ml solution was injected into the anterior chamber for a dose of 1 mg of intracameral antibiotic at the completion of the case.   Timolol and Brimonidine drops were applied to the eye.  The patient was taken to the recovery room in stable condition without complications of anesthesia or surgery.   Bernardette Waldron 06/30/2023, 12:00 PM

## 2023-06-30 NOTE — H&P (Signed)
 [facility]   Primary Care Physician:  [facility] Ophthalmologist: Dr. Lockie Mola  Pre-Procedure History & Physical: HPI:  Sheila Johnston is a 78 y.o. female here for ophthalmic surgery.   Past Medical History:  Diagnosis Date   Arthritis    GERD (gastroesophageal reflux disease)    Hypothyroidism     Past Surgical History:  Procedure Laterality Date   ABDOMINAL HYSTERECTOMY     CATARACT EXTRACTION W/PHACO Left 06/16/2023   Procedure: CATARACT EXTRACTION PHACO AND INTRAOCULAR LENS PLACEMENT (IOC) LEFT;  Surgeon: Lockie Mola, MD;  Location[facility];  Service: Ophthalmology;  Laterality: Left;  6.88 0:33.1   CHOLECYSTECTOMY     COLONOSCOPY WITH PROPOFOL N/A 07/29/2015   Procedure: COLONOSCOPY WITH PROPOFOL;  Surgeon: Wallace Cullens, MD;  Location: [facility] ENDOSCOPY;  Service: Gastroenterology;  Laterality: N/A;    Prior to Admission medications   Medication Sig Start Date End Date Taking? Authorizing Provider  Cholecalciferol 125 MCG (5000 UT) TABS Take 1 tablet by mouth daily.   Yes [provider]  fluticasone (FLONASE) 50 MCG/ACT nasal spray Place 2 sprays into both nostrils daily.   Yes [provider]  levothyroxine (SYNTHROID, LEVOTHROID) 75 MCG tablet Take 75 mcg by mouth daily before breakfast.   Yes [provider]  pantoprazole (PROTONIX) 40 MG tablet Take 40 mg by mouth daily.   Yes [provider]  rosuvastatin (CRESTOR) 5 MG tablet Take 5 mg by mouth daily.   Yes [provider]  Calcium Ascorbate 500 MG TABS Take 500 tablets by mouth daily. Reported on 07/29/2015 Patient not taking: Reported on 06/08/2023    [provider]  Cyanocobalamin (VITAMIN B 12) 250 MCG LOZG Take 2 tablets by mouth daily. Patient not taking: Reported on 06/08/2023    [provider]  Omega-3 Fatty Acids (OCEAN BLUE MINICAPS OMEGA-3) 350 MG CAPS Take by mouth. Patient not taking: Reported on  06/08/2023    [provider]  vitamin B-12 (CYANOCOBALAMIN) 500 MCG tablet Take by mouth. Patient not taking: Reported on 06/08/2023    [provider]    Allergies as of 05/10/2023   (No Known Allergies)    Family History  Problem Relation Age of Onset   Breast cancer Neg Hx     Social History   Socioeconomic History   Marital status: Divorced    Spouse name: Not on file   Number of children: Not on file   Years of education: Not on file   Highest education level: Not on file  Occupational History   Not on file  Tobacco Use   Smoking status: Former    Current packs/day: 0.00    Average packs/day: 1 pack/day for 44.0 years (44.0 ttl pk-yrs)    Types: Cigarettes    Start date: 04/14/1963    Quit date: 04/14/2007    Years since quitting: 16.2   Smokeless tobacco: Not on file  Substance and Sexual Activity   Alcohol use: No   Drug use: No   Sexual activity: Not on file  Other Topics Concern   Not on file  Social History Narrative   Not on file   Social Drivers of Health   Financial Resource Strain: Low Risk  (12/17/2022)   Received from [facility]   Overall Financial Resource Strain (CARDIA)    Difficulty of Paying Living Expenses: Not hard at all  Food Insecurity: No Food Insecurity (12/17/2022)   Received from [facility]   Hunger Vital Sign    Worried About Running Out of Food in the Last Year: Never true    Ran Out of Food in the Last Year: Never true  Transportation Needs: No Transportation Needs (12/17/2022)   Received from [facility]    In the past 12 months, has lack of transportation kept you from medical appointments or from getting medications?: No    Lack of Transportation (Non-Medical): No  Physical Activity: Not on file  Stress: Not on file  Social Connections: Not on file  Intimate Partner Violence: Not on file    Review of Systems: See HPI, otherwise  negative ROS  Physical Exam: BP 122/74   Pulse 73   Temp (!) 97.4 F (36.3 C) (Temporal)   Resp 12   Ht 5\' 3"  (1.6 m)   Wt 72.1 kg   SpO2 97%   BMI 28.15 kg/m  General:   Alert,  pleasant and cooperative in NAD Head:  Normocephalic and atraumatic. Lungs:  Clear to auscultation.    Heart:  Regular rate and rhythm.   Impression/Plan: Sheila Johnston is here for ophthalmic surgery.  Risks, benefits, limitations, and alternatives regarding ophthalmic surgery have been reviewed with the patient.  Questions have been answered.  All parties agreeable.   Lockie Mola, MD  06/30/2023, 11:27 AM

## 2023-07-01 ENCOUNTER — Encounter: Payer: Self-pay | Admitting: Ophthalmology

## 2023-07-01 NOTE — Op Note (Signed)
 LOCATION:  [facility]   PREOPERATIVE DIAGNOSIS:    Nuclear sclerotic cataract right eye. H25.11   POSTOPERATIVE DIAGNOSIS:  Nuclear sclerotic cataract right eye.     PROCEDURE:  Phacoemusification with posterior chamber intraocular lens placement of the right eye   ULTRASOUND TIME: Procedure(s): CATARACT EXTRACTION PHACO AND INTRAOCULAR LENS PLACEMENT (IOC) RIGHT 4.08, 00:26.7 (Right)  LENS:   Implant Name Type Inv. Item Serial No. Manufacturer Lot No. LRB No. Used Action  LENS IOL TECNIS EYHANCE 21.5 - [id number] Intraocular Lens LENS IOL TECNIS EYHANCE 21.5 [id number] [employer]  Right 1 Implanted         SURGEON:  Deirdre Evener, MD   ANESTHESIA:  Topical with tetracaine drops and 2% Xylocaine jelly, augmented with 1% preservative-free intracameral lidocaine.    COMPLICATIONS:  None.   DESCRIPTION OF PROCEDURE:  The patient was identified in the holding room and transported to the operating room and placed in the supine position under the operating microscope.  The right eye was identified as the operative eye and it was prepped and draped in the usual sterile ophthalmic fashion.   A 1 millimeter clear-corneal paracentesis was made at the 12:00 position.  0.5 ml of preservative-free 1% lidocaine was injected into the anterior chamber. The anterior chamber was filled with Viscoat viscoelastic.  A 2.4 millimeter keratome was used to make a near-clear corneal incision at the 9:00 position.  A curvilinear capsulorrhexis was made with a cystotome and capsulorrhexis forceps.  Balanced salt solution was used to hydrodissect and hydrodelineate the nucleus.   Phacoemulsification was then used in stop and chop fashion to remove the lens nucleus and epinucleus.  The remaining cortex was then removed using the irrigation and aspiration handpiece. Provisc was then placed into the capsular bag to distend it for lens placement.  A lens was then injected into the capsular bag.  The  remaining viscoelastic was aspirated.   Wounds were hydrated with balanced salt solution.  The anterior chamber was inflated to a physiologic pressure with balanced salt solution.  No wound leaks were noted. Cefuroxime 0.1 ml of a 10mg /ml solution was injected into the anterior chamber for a dose of 1 mg of intracameral antibiotic at the completion of the case.   Timolol and Brimonidine drops were applied to the eye.  The patient was taken to the recovery room in stable condition without complications of anesthesia or surgery.   Hawraa Stambaugh 07/01/2023, 12:02 PM

## 2023-07-09 DIAGNOSIS — K219 Gastro-esophageal reflux disease without esophagitis: Secondary | ICD-10-CM | POA: Diagnosis not present

## 2023-07-09 DIAGNOSIS — Z1231 Encounter for screening mammogram for malignant neoplasm of breast: Secondary | ICD-10-CM | POA: Diagnosis not present

## 2023-07-09 DIAGNOSIS — E039 Hypothyroidism, unspecified: Secondary | ICD-10-CM | POA: Diagnosis not present

## 2023-07-09 DIAGNOSIS — I700 Atherosclerosis of aorta: Secondary | ICD-10-CM | POA: Diagnosis not present

## 2023-07-09 DIAGNOSIS — R7302 Impaired glucose tolerance (oral): Secondary | ICD-10-CM | POA: Diagnosis not present

## 2023-07-09 DIAGNOSIS — Z79899 Other long term (current) drug therapy: Secondary | ICD-10-CM | POA: Diagnosis not present

## 2023-07-09 DIAGNOSIS — Z0000 Encounter for general adult medical examination without abnormal findings: Secondary | ICD-10-CM | POA: Diagnosis not present

## 2023-07-09 DIAGNOSIS — J432 Centrilobular emphysema: Secondary | ICD-10-CM | POA: Diagnosis not present

## 2023-07-09 DIAGNOSIS — E669 Obesity, unspecified: Secondary | ICD-10-CM | POA: Diagnosis not present

## 2023-07-09 DIAGNOSIS — E782 Mixed hyperlipidemia: Secondary | ICD-10-CM | POA: Diagnosis not present

## 2023-07-20 DIAGNOSIS — H35373 Puckering of macula, bilateral: Secondary | ICD-10-CM | POA: Diagnosis not present

## 2023-07-23 ENCOUNTER — Other Ambulatory Visit: Payer: Self-pay | Admitting: Nurse Practitioner

## 2023-07-23 DIAGNOSIS — Z1231 Encounter for screening mammogram for malignant neoplasm of breast: Secondary | ICD-10-CM

## 2023-07-30 ENCOUNTER — Ambulatory Visit
Admission: RE | Admit: 2023-07-30 | Discharge: 2023-07-30 | Disposition: A | Discharge status: Home / Self Care | Source: Ambulatory Visit | Attending: Nurse Practitioner | Admitting: Nurse Practitioner

## 2023-07-30 DIAGNOSIS — Z1231 Encounter for screening mammogram for malignant neoplasm of breast: Secondary | ICD-10-CM | POA: Diagnosis not present

## 2023-08-10 DIAGNOSIS — H35373 Puckering of macula, bilateral: Secondary | ICD-10-CM | POA: Diagnosis not present

## 2023-08-30 DIAGNOSIS — M9904 Segmental and somatic dysfunction of sacral region: Secondary | ICD-10-CM | POA: Diagnosis not present

## 2023-08-30 DIAGNOSIS — M5442 Lumbago with sciatica, left side: Secondary | ICD-10-CM | POA: Diagnosis not present

## 2023-08-30 DIAGNOSIS — M9903 Segmental and somatic dysfunction of lumbar region: Secondary | ICD-10-CM | POA: Diagnosis not present

## 2023-08-30 DIAGNOSIS — M51362 Other intervertebral disc degeneration, lumbar region with discogenic back pain and lower extremity pain: Secondary | ICD-10-CM | POA: Diagnosis not present

## 2023-08-31 DIAGNOSIS — M9903 Segmental and somatic dysfunction of lumbar region: Secondary | ICD-10-CM | POA: Diagnosis not present

## 2023-08-31 DIAGNOSIS — M51362 Other intervertebral disc degeneration, lumbar region with discogenic back pain and lower extremity pain: Secondary | ICD-10-CM | POA: Diagnosis not present

## 2023-08-31 DIAGNOSIS — M5442 Lumbago with sciatica, left side: Secondary | ICD-10-CM | POA: Diagnosis not present

## 2023-08-31 DIAGNOSIS — M9904 Segmental and somatic dysfunction of sacral region: Secondary | ICD-10-CM | POA: Diagnosis not present

## 2023-09-01 DIAGNOSIS — M5442 Lumbago with sciatica, left side: Secondary | ICD-10-CM | POA: Diagnosis not present

## 2023-09-01 DIAGNOSIS — M9904 Segmental and somatic dysfunction of sacral region: Secondary | ICD-10-CM | POA: Diagnosis not present

## 2023-09-01 DIAGNOSIS — M9903 Segmental and somatic dysfunction of lumbar region: Secondary | ICD-10-CM | POA: Diagnosis not present

## 2023-09-01 DIAGNOSIS — M51362 Other intervertebral disc degeneration, lumbar region with discogenic back pain and lower extremity pain: Secondary | ICD-10-CM | POA: Diagnosis not present

## 2023-09-02 DIAGNOSIS — M9904 Segmental and somatic dysfunction of sacral region: Secondary | ICD-10-CM | POA: Diagnosis not present

## 2023-09-02 DIAGNOSIS — M9903 Segmental and somatic dysfunction of lumbar region: Secondary | ICD-10-CM | POA: Diagnosis not present

## 2023-09-02 DIAGNOSIS — M5442 Lumbago with sciatica, left side: Secondary | ICD-10-CM | POA: Diagnosis not present

## 2023-09-02 DIAGNOSIS — M51362 Other intervertebral disc degeneration, lumbar region with discogenic back pain and lower extremity pain: Secondary | ICD-10-CM | POA: Diagnosis not present

## 2023-09-07 DIAGNOSIS — M9904 Segmental and somatic dysfunction of sacral region: Secondary | ICD-10-CM | POA: Diagnosis not present

## 2023-09-07 DIAGNOSIS — M9903 Segmental and somatic dysfunction of lumbar region: Secondary | ICD-10-CM | POA: Diagnosis not present

## 2023-09-07 DIAGNOSIS — M51362 Other intervertebral disc degeneration, lumbar region with discogenic back pain and lower extremity pain: Secondary | ICD-10-CM | POA: Diagnosis not present

## 2023-09-07 DIAGNOSIS — M5442 Lumbago with sciatica, left side: Secondary | ICD-10-CM | POA: Diagnosis not present

## 2023-09-08 DIAGNOSIS — M9904 Segmental and somatic dysfunction of sacral region: Secondary | ICD-10-CM | POA: Diagnosis not present

## 2023-09-08 DIAGNOSIS — M9903 Segmental and somatic dysfunction of lumbar region: Secondary | ICD-10-CM | POA: Diagnosis not present

## 2023-09-08 DIAGNOSIS — M51362 Other intervertebral disc degeneration, lumbar region with discogenic back pain and lower extremity pain: Secondary | ICD-10-CM | POA: Diagnosis not present

## 2023-09-08 DIAGNOSIS — M5442 Lumbago with sciatica, left side: Secondary | ICD-10-CM | POA: Diagnosis not present

## 2023-09-09 DIAGNOSIS — M5442 Lumbago with sciatica, left side: Secondary | ICD-10-CM | POA: Diagnosis not present

## 2023-09-09 DIAGNOSIS — M51362 Other intervertebral disc degeneration, lumbar region with discogenic back pain and lower extremity pain: Secondary | ICD-10-CM | POA: Diagnosis not present

## 2023-09-09 DIAGNOSIS — H35373 Puckering of macula, bilateral: Secondary | ICD-10-CM | POA: Diagnosis not present

## 2023-09-09 DIAGNOSIS — M9904 Segmental and somatic dysfunction of sacral region: Secondary | ICD-10-CM | POA: Diagnosis not present

## 2023-09-09 DIAGNOSIS — Z961 Presence of intraocular lens: Secondary | ICD-10-CM | POA: Diagnosis not present

## 2023-09-09 DIAGNOSIS — M9903 Segmental and somatic dysfunction of lumbar region: Secondary | ICD-10-CM | POA: Diagnosis not present

## 2023-09-13 DIAGNOSIS — M5442 Lumbago with sciatica, left side: Secondary | ICD-10-CM | POA: Diagnosis not present

## 2023-09-13 DIAGNOSIS — M51362 Other intervertebral disc degeneration, lumbar region with discogenic back pain and lower extremity pain: Secondary | ICD-10-CM | POA: Diagnosis not present

## 2023-09-13 DIAGNOSIS — M9904 Segmental and somatic dysfunction of sacral region: Secondary | ICD-10-CM | POA: Diagnosis not present

## 2023-09-13 DIAGNOSIS — M9903 Segmental and somatic dysfunction of lumbar region: Secondary | ICD-10-CM | POA: Diagnosis not present

## 2023-09-15 DIAGNOSIS — M5442 Lumbago with sciatica, left side: Secondary | ICD-10-CM | POA: Diagnosis not present

## 2023-09-15 DIAGNOSIS — M9904 Segmental and somatic dysfunction of sacral region: Secondary | ICD-10-CM | POA: Diagnosis not present

## 2023-09-15 DIAGNOSIS — M51362 Other intervertebral disc degeneration, lumbar region with discogenic back pain and lower extremity pain: Secondary | ICD-10-CM | POA: Diagnosis not present

## 2023-09-15 DIAGNOSIS — M9903 Segmental and somatic dysfunction of lumbar region: Secondary | ICD-10-CM | POA: Diagnosis not present

## 2023-09-16 DIAGNOSIS — M9904 Segmental and somatic dysfunction of sacral region: Secondary | ICD-10-CM | POA: Diagnosis not present

## 2023-09-16 DIAGNOSIS — M51362 Other intervertebral disc degeneration, lumbar region with discogenic back pain and lower extremity pain: Secondary | ICD-10-CM | POA: Diagnosis not present

## 2023-09-16 DIAGNOSIS — M5442 Lumbago with sciatica, left side: Secondary | ICD-10-CM | POA: Diagnosis not present

## 2023-09-16 DIAGNOSIS — M9903 Segmental and somatic dysfunction of lumbar region: Secondary | ICD-10-CM | POA: Diagnosis not present

## 2023-09-20 DIAGNOSIS — M51362 Other intervertebral disc degeneration, lumbar region with discogenic back pain and lower extremity pain: Secondary | ICD-10-CM | POA: Diagnosis not present

## 2023-09-20 DIAGNOSIS — M9903 Segmental and somatic dysfunction of lumbar region: Secondary | ICD-10-CM | POA: Diagnosis not present

## 2023-09-20 DIAGNOSIS — M9904 Segmental and somatic dysfunction of sacral region: Secondary | ICD-10-CM | POA: Diagnosis not present

## 2023-09-20 DIAGNOSIS — M5442 Lumbago with sciatica, left side: Secondary | ICD-10-CM | POA: Diagnosis not present

## 2023-09-22 DIAGNOSIS — M9904 Segmental and somatic dysfunction of sacral region: Secondary | ICD-10-CM | POA: Diagnosis not present

## 2023-09-22 DIAGNOSIS — M5442 Lumbago with sciatica, left side: Secondary | ICD-10-CM | POA: Diagnosis not present

## 2023-09-22 DIAGNOSIS — M9903 Segmental and somatic dysfunction of lumbar region: Secondary | ICD-10-CM | POA: Diagnosis not present

## 2023-09-22 DIAGNOSIS — M51362 Other intervertebral disc degeneration, lumbar region with discogenic back pain and lower extremity pain: Secondary | ICD-10-CM | POA: Diagnosis not present

## 2023-09-23 DIAGNOSIS — M51362 Other intervertebral disc degeneration, lumbar region with discogenic back pain and lower extremity pain: Secondary | ICD-10-CM | POA: Diagnosis not present

## 2023-09-23 DIAGNOSIS — M9904 Segmental and somatic dysfunction of sacral region: Secondary | ICD-10-CM | POA: Diagnosis not present

## 2023-09-23 DIAGNOSIS — M9903 Segmental and somatic dysfunction of lumbar region: Secondary | ICD-10-CM | POA: Diagnosis not present

## 2023-09-23 DIAGNOSIS — M5442 Lumbago with sciatica, left side: Secondary | ICD-10-CM | POA: Diagnosis not present

## 2023-10-20 DIAGNOSIS — Z961 Presence of intraocular lens: Secondary | ICD-10-CM | POA: Diagnosis not present

## 2023-12-06 DIAGNOSIS — M9903 Segmental and somatic dysfunction of lumbar region: Secondary | ICD-10-CM | POA: Diagnosis not present

## 2023-12-06 DIAGNOSIS — M51362 Other intervertebral disc degeneration, lumbar region with discogenic back pain and lower extremity pain: Secondary | ICD-10-CM | POA: Diagnosis not present

## 2023-12-06 DIAGNOSIS — M5442 Lumbago with sciatica, left side: Secondary | ICD-10-CM | POA: Diagnosis not present

## 2023-12-06 DIAGNOSIS — M9904 Segmental and somatic dysfunction of sacral region: Secondary | ICD-10-CM | POA: Diagnosis not present

## 2024-01-20 DIAGNOSIS — E039 Hypothyroidism, unspecified: Secondary | ICD-10-CM | POA: Diagnosis not present

## 2024-01-20 DIAGNOSIS — J439 Emphysema, unspecified: Secondary | ICD-10-CM | POA: Diagnosis not present

## 2024-01-20 DIAGNOSIS — Z1331 Encounter for screening for depression: Secondary | ICD-10-CM | POA: Diagnosis not present

## 2024-01-20 DIAGNOSIS — J302 Other seasonal allergic rhinitis: Secondary | ICD-10-CM | POA: Diagnosis not present

## 2024-01-20 DIAGNOSIS — Z79899 Other long term (current) drug therapy: Secondary | ICD-10-CM | POA: Diagnosis not present

## 2024-01-20 DIAGNOSIS — E782 Mixed hyperlipidemia: Secondary | ICD-10-CM | POA: Diagnosis not present

## 2024-01-20 DIAGNOSIS — R7302 Impaired glucose tolerance (oral): Secondary | ICD-10-CM | POA: Diagnosis not present

## 2024-01-20 DIAGNOSIS — K219 Gastro-esophageal reflux disease without esophagitis: Secondary | ICD-10-CM | POA: Diagnosis not present

## 2024-01-20 DIAGNOSIS — I700 Atherosclerosis of aorta: Secondary | ICD-10-CM | POA: Diagnosis not present

## 2024-01-24 DIAGNOSIS — N390 Urinary tract infection, site not specified: Secondary | ICD-10-CM | POA: Diagnosis not present

## 2024-02-03 DIAGNOSIS — Z85828 Personal history of other malignant neoplasm of skin: Secondary | ICD-10-CM | POA: Diagnosis not present

## 2024-02-03 DIAGNOSIS — L821 Other seborrheic keratosis: Secondary | ICD-10-CM | POA: Diagnosis not present

## 2024-02-03 DIAGNOSIS — D225 Melanocytic nevi of trunk: Secondary | ICD-10-CM | POA: Diagnosis not present

## 2024-02-03 DIAGNOSIS — D2262 Melanocytic nevi of left upper limb, including shoulder: Secondary | ICD-10-CM | POA: Diagnosis not present

## 2024-02-03 DIAGNOSIS — D2261 Melanocytic nevi of right upper limb, including shoulder: Secondary | ICD-10-CM | POA: Diagnosis not present

## 2024-02-03 DIAGNOSIS — D2271 Melanocytic nevi of right lower limb, including hip: Secondary | ICD-10-CM | POA: Diagnosis not present

## 2024-02-03 DIAGNOSIS — D2272 Melanocytic nevi of left lower limb, including hip: Secondary | ICD-10-CM | POA: Diagnosis not present

## 2024-02-03 DIAGNOSIS — Z08 Encounter for follow-up examination after completed treatment for malignant neoplasm: Secondary | ICD-10-CM | POA: Diagnosis not present

## 2024-03-16 DIAGNOSIS — Z961 Presence of intraocular lens: Secondary | ICD-10-CM | POA: Diagnosis not present

## 2024-03-16 DIAGNOSIS — H35373 Puckering of macula, bilateral: Secondary | ICD-10-CM | POA: Diagnosis not present

## 2024-03-16 DIAGNOSIS — H04123 Dry eye syndrome of bilateral lacrimal glands: Secondary | ICD-10-CM | POA: Diagnosis not present

## 2024-03-16 DIAGNOSIS — H43813 Vitreous degeneration, bilateral: Secondary | ICD-10-CM | POA: Diagnosis not present

## 2024-03-20 ENCOUNTER — Telehealth: Payer: Self-pay

## 2024-03-20 NOTE — Telephone Encounter (Signed)
 Returned call. Reviewed LCS results from 2024. Advised per CMS guidelines she no longer qualifies. Pt verbalized understanding. Provided patient with the CPT for LCS as she may choose to complete one more scan for piece of mind out of pocket.   Copied from CRM [id number]. Topic: Appointments - Scheduling Inquiry for Clinic >> Mar 17, 2024  2:04 PM Corean SAUNDERS wrote: Reason for CRM: Patient states she get a call every year to schedule her annual lung cancer screening but no one called her this year and she is requesting someone to call her to schedule.
